# Patient Record
Sex: Female | Born: 1980 | Race: White | Hispanic: No | Marital: Single | Smoking: Never smoker
Health system: Southern US, Community
[De-identification: ages and names within clinical notes are randomized; demographics above are authoritative.]

## PROBLEM LIST (undated history)

## (undated) DIAGNOSIS — J302 Other seasonal allergic rhinitis: Secondary | ICD-10-CM

## (undated) DIAGNOSIS — F411 Generalized anxiety disorder: Secondary | ICD-10-CM

## (undated) DIAGNOSIS — F4310 Post-traumatic stress disorder, unspecified: Secondary | ICD-10-CM

## (undated) HISTORY — PX: WISDOM TOOTH EXTRACTION: SHX21

## (undated) HISTORY — DX: Other seasonal allergic rhinitis: J30.2

## (undated) HISTORY — DX: Generalized anxiety disorder: F41.1

## (undated) HISTORY — DX: Morbid (severe) obesity due to excess calories: E66.01

## (undated) HISTORY — DX: Post-traumatic stress disorder, unspecified: F43.10

---

## 2008-01-03 ENCOUNTER — Emergency Department (HOSPITAL_COMMUNITY): Admission: EM | Admit: 2008-01-03 | Discharge: 2008-01-03 | Payer: Self-pay | Admitting: Emergency Medicine

## 2008-02-05 ENCOUNTER — Encounter (INDEPENDENT_AMBULATORY_CARE_PROVIDER_SITE_OTHER): Payer: Self-pay | Admitting: Neurology

## 2008-02-05 ENCOUNTER — Ambulatory Visit: Payer: Self-pay

## 2008-02-26 ENCOUNTER — Ambulatory Visit: Payer: Self-pay | Admitting: Internal Medicine

## 2009-04-29 IMAGING — CT CT HEAD W/O CM
1 series · 16 of 30 positions shown, 20 images · non-contrast
Comparison: None

CLINICAL DATA: SEIZURE

CT HEAD WITHOUT CONTRAST
TECHNIQUE: Contiguous axial images were obtained from the base of
the skull through the vertex without contrast

[Series 2: brain · axial · 0.47mm/px · z∈[+115,+242]mm · 16 of 44 slices shown, 20 images]
[im 2/44  brain]
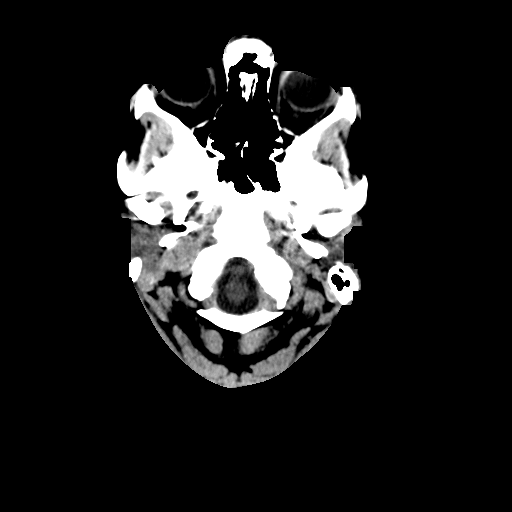
[im 2/44  bone]
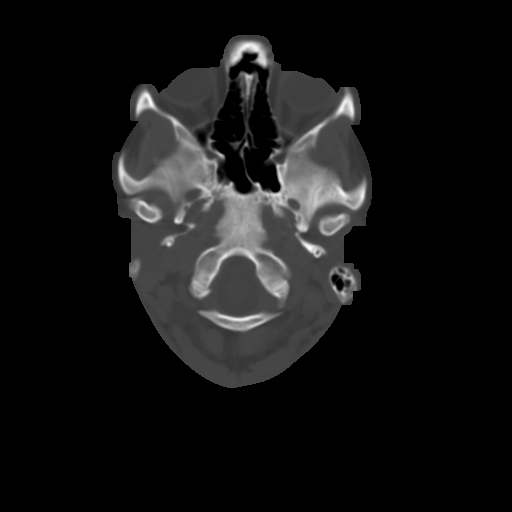
[im 5/44  brain]
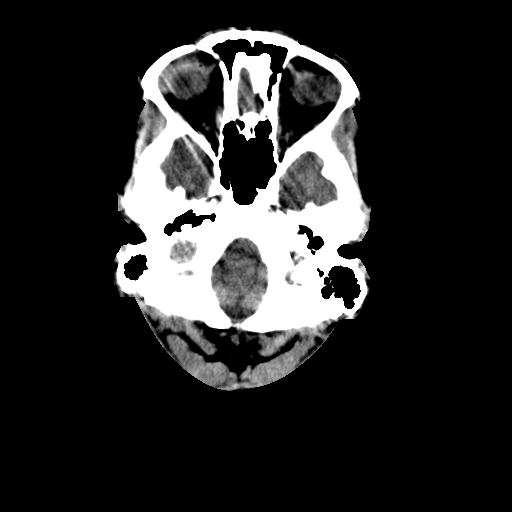
[im 8/44  brain]
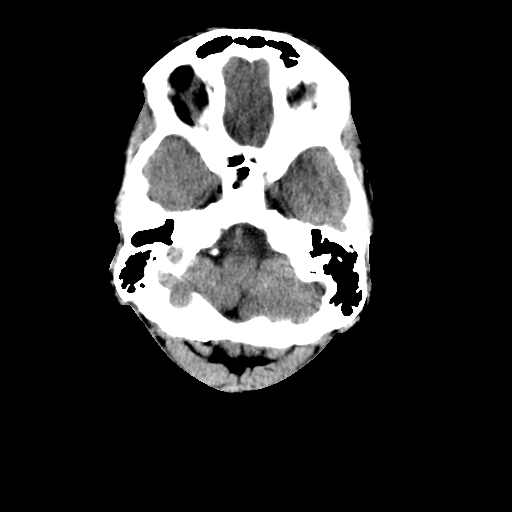
[im 11/44  brain]
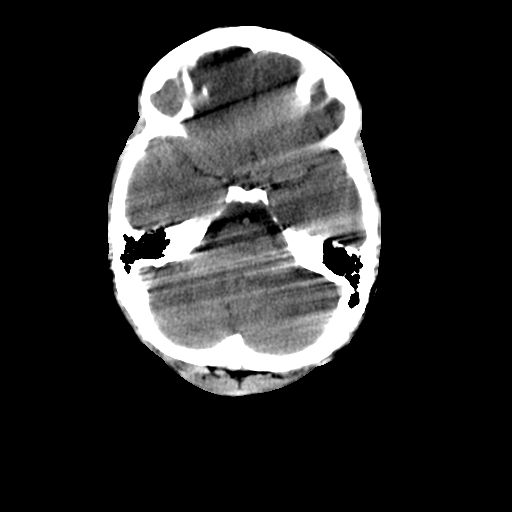
[im 12/44  brain]
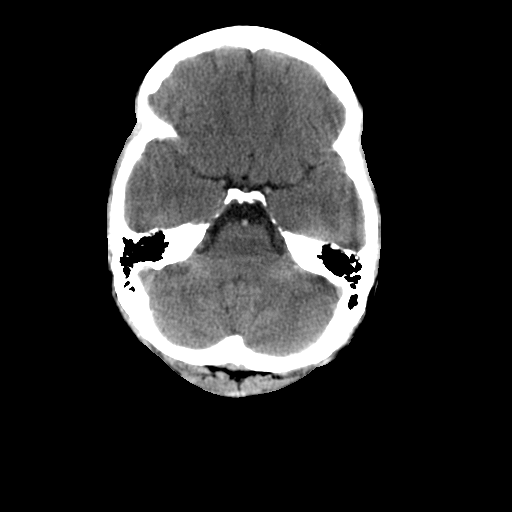
[im 12/44  bone]
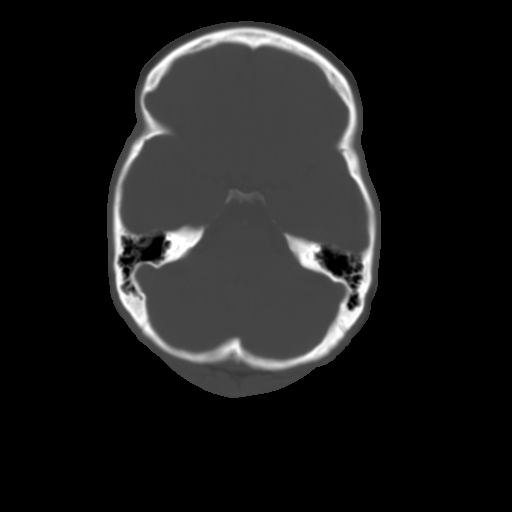
[im 15/44  brain]
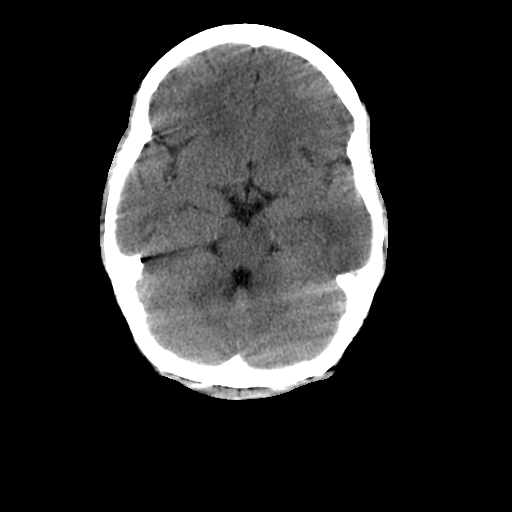
[im 18/44  brain]
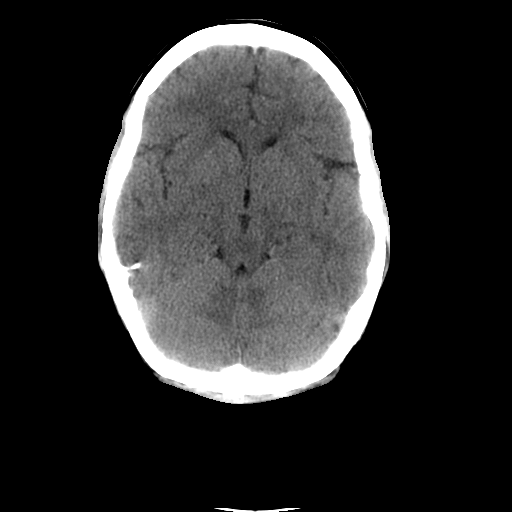
[im 21/44  brain]
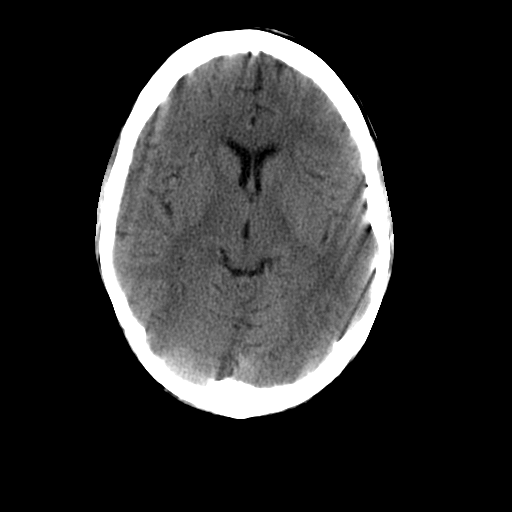
[im 23/44  brain]
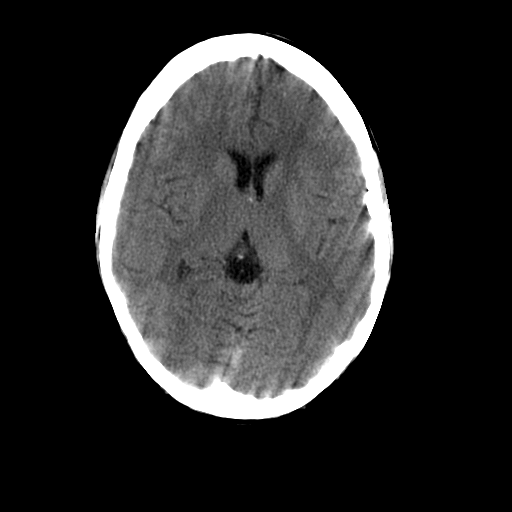
[im 23/44  bone]
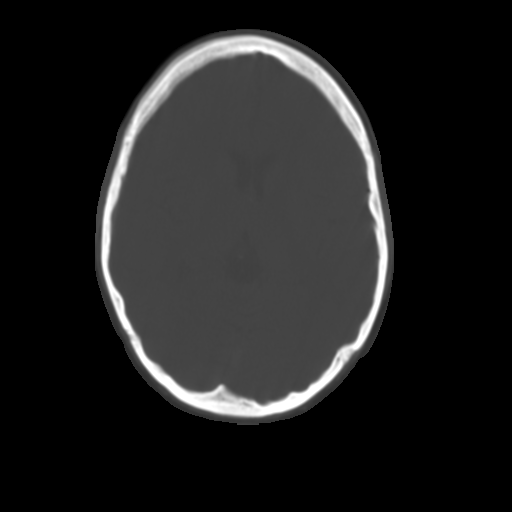
[im 26/44  brain]
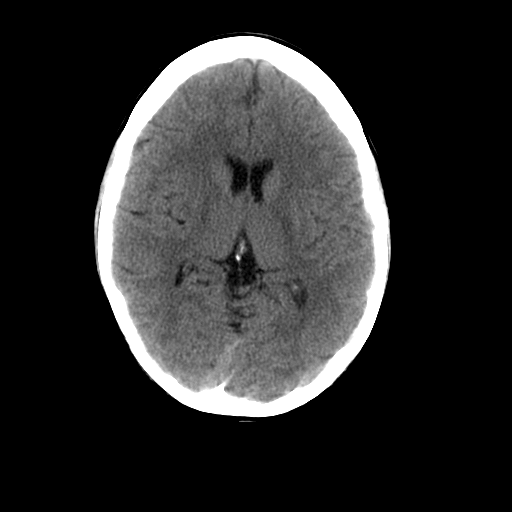
[im 29/44  brain]
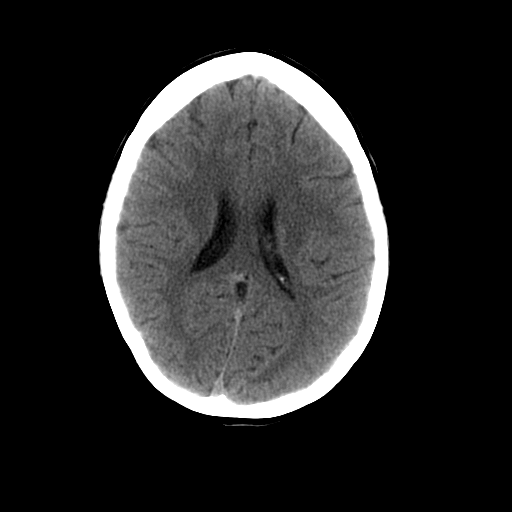
[im 32/44  brain]
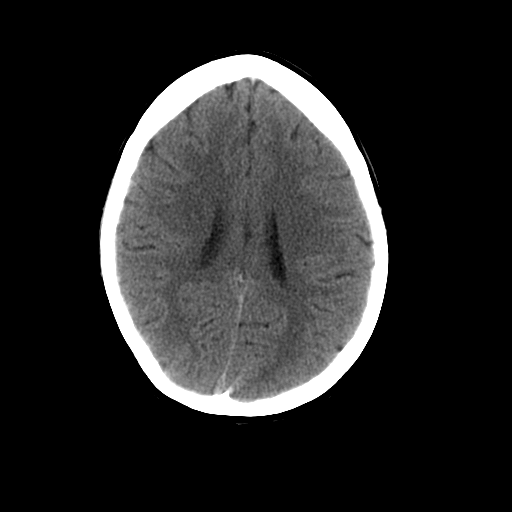
[im 33/44  brain]
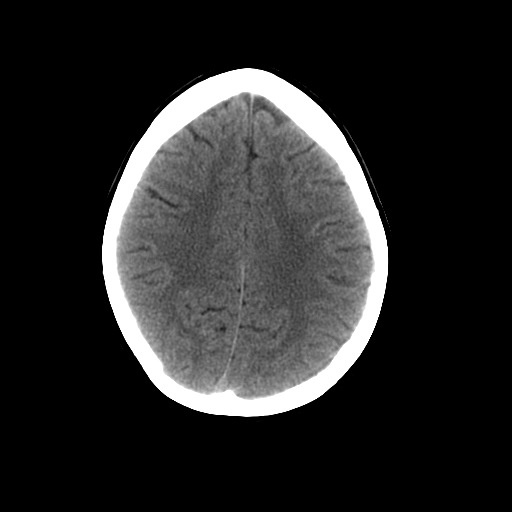
[im 33/44  bone]
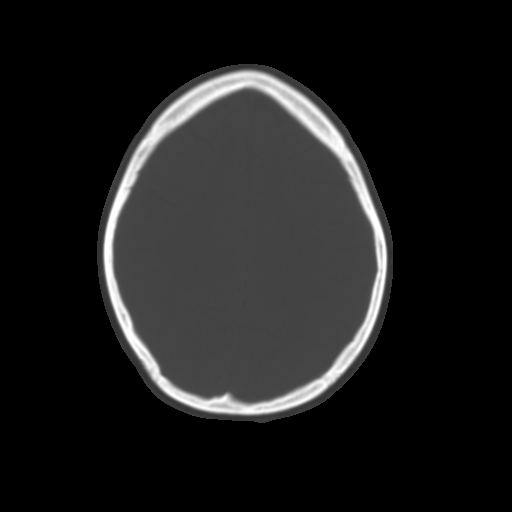
[im 36/44  brain]
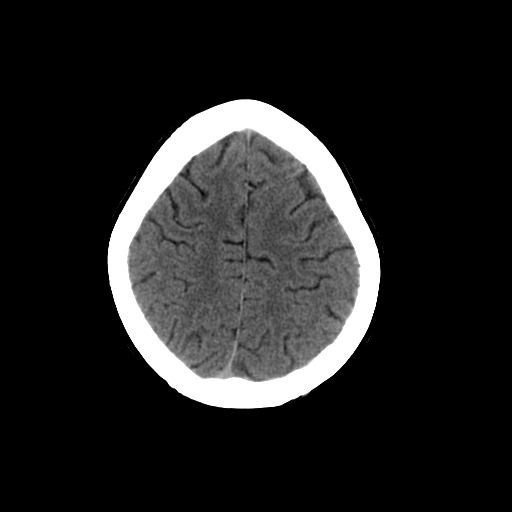
[im 39/44  brain]
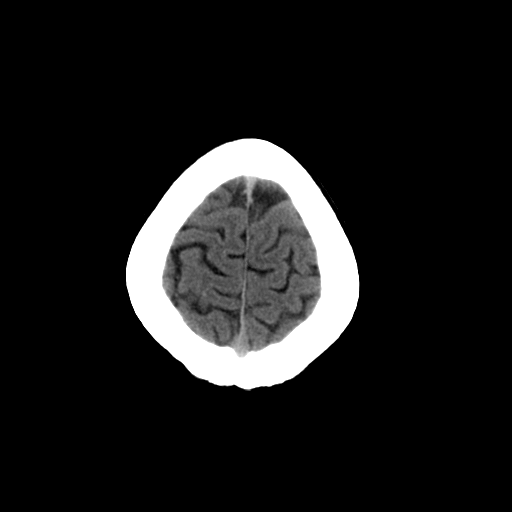
[im 42/44  brain]
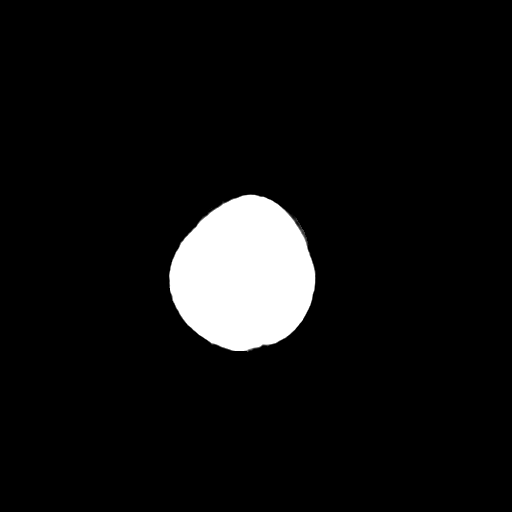

[16 of 30 positions shown; findings below may reference images not displayed]

FINDINGS: The brain has a normal appearance without evidence for
hemorrhage, acute infarction, hydrocephalus, or mass lesion.  There
is no extra axial fluid collection.  The skull and paranasal
sinuses are normal. Motion artifact degrades [HOSPITAL] the skull
base.
IMPRESSION: Normal CT of the head without contrast.
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
## 2011-06-06 LAB — DIFFERENTIAL
Basophils Absolute: 0
Basophils Relative: 0
Eosinophils Relative: 0
Monocytes Absolute: 0.6
Monocytes Relative: 4
Neutro Abs: 10.6 — ABNORMAL HIGH

## 2011-06-06 LAB — POCT PREGNANCY, URINE
Operator id: 22250
Preg Test, Ur: NEGATIVE

## 2011-06-06 LAB — CBC
HCT: 42.5
Hemoglobin: 15.2 — ABNORMAL HIGH
MCHC: 35.8
MCV: 92
RBC: 4.62
RDW: 11.4 — ABNORMAL LOW

## 2011-06-06 LAB — POCT I-STAT, CHEM 8
Calcium, Ion: 1.18
Chloride: 102
Glucose, Bld: 92
HCT: 44
TCO2: 25

## 2011-06-06 LAB — URINALYSIS, ROUTINE W REFLEX MICROSCOPIC
Bilirubin Urine: NEGATIVE
Ketones, ur: 15 — AB
Leukocytes, UA: NEGATIVE
Nitrite: NEGATIVE
Protein, ur: NEGATIVE
Urobilinogen, UA: 0.2
pH: 6

## 2011-06-06 LAB — URINE MICROSCOPIC-ADD ON

## 2012-07-23 ENCOUNTER — Ambulatory Visit (HOSPITAL_COMMUNITY): Payer: Self-pay | Admitting: Psychiatry

## 2012-10-03 ENCOUNTER — Ambulatory Visit: Payer: Self-pay | Admitting: Emergency Medicine

## 2012-10-03 VITALS — BP 128/89 | HR 76 | Temp 97.9°F | Resp 16 | Ht 64.25 in | Wt 221.0 lb

## 2012-10-03 DIAGNOSIS — N3000 Acute cystitis without hematuria: Secondary | ICD-10-CM

## 2012-10-03 DIAGNOSIS — N390 Urinary tract infection, site not specified: Secondary | ICD-10-CM

## 2012-10-03 LAB — POCT URINALYSIS DIPSTICK
Glucose, UA: NEGATIVE
Nitrite, UA: NEGATIVE
Urobilinogen, UA: 0.2
pH, UA: 6

## 2012-10-03 LAB — POCT UA - MICROSCOPIC ONLY
Casts, Ur, LPF, POC: NEGATIVE
Mucus, UA: NEGATIVE
Yeast, UA: NEGATIVE

## 2012-10-03 MED ORDER — PHENAZOPYRIDINE HCL 200 MG PO TABS: 200.0000 mg | ORAL_TABLET | Freq: Three times a day (TID) | ORAL | Status: AC | PRN

## 2012-10-03 MED ORDER — SULFAMETHOXAZOLE-TRIMETHOPRIM 800-160 MG PO TABS: 1.0000 | ORAL_TABLET | Freq: Two times a day (BID) | ORAL | Status: DC

## 2012-10-03 NOTE — Progress Notes (Signed)
[facility] [address] [place] [address] [phone]  Date:  10/03/2012   Name:  Nicole Burke   DOB:  10/24/1980   MRN:  [id number]  PCP:  No primary provider on file.    Chief Complaint: Urinary Tract Infection   History of Present Illness:  Nicole Burke is a 32 y.o. very pleasant female patient who presents with the following:  2 day history of dysuria and urgency and frequent urination.  No fever or chills, back pain.  Some nausea tonight and no vomiting or stool change. Developed some low back pain tonight.  Never had a UTI previously.  No GYN or GI symptoms.  There is no problem list on file for this patient.   Past Medical History  Diagnosis Date  . Anxiety   . Depression     Past Surgical History  Procedure Date  . Wisdom tooth extraction     History  Substance Use Topics  . Smoking status: Never Smoker   . Smokeless tobacco: Not on file  . Alcohol Use: 3.5 oz/week    7 drink(s) per week    Family History  Problem Relation Age of Onset  . Cancer Mother   . Anxiety disorder Mother   . Asthma Father   . Anxiety disorder Sister   . Kidney disease Maternal Grandmother   . Heart disease Paternal Grandmother   . Heart disease Paternal Grandfather     Allergies  Allergen Reactions  . Erythromycin Rash    Medication list has been reviewed and updated.  Current Outpatient Prescriptions on File Prior to Visit  Medication Sig Dispense Refill  . citalopram (CELEXA) 10 MG tablet Take 10 mg by mouth daily.        Review of Systems:  As per HPI, otherwise negative.    Physical Examination: Filed Vitals:   10/03/12 1919  BP: 128/89  Pulse: 76  Temp: 97.9 F (36.6 C)  Resp: 16   Filed Vitals:   10/03/12 1919  Height: 5' 4.25" (1.632 m)  Weight: 221 lb (100.245 kg)   Body mass index is 37.64 kg/(m^2). Ideal Body Weight: Weight in (lb) to have BMI = 25: 146.5   GEN: WDWN, NAD, Non-toxic, A & O x 3 HEENT: Atraumatic,  Normocephalic. Neck supple. No masses, No LAD. Ears and Nose: No external deformity. CV: RRR, No M/G/R. No JVD. No thrill. No extra heart sounds. PULM: CTA B, no wheezes, crackles, rhonchi. No retractions. No resp. distress. No accessory muscle use. ABD: S, NT, ND, +BS. No rebound. No HSM. EXTR: No c/c/e NEURO Normal gait.  PSYCH: Normally interactive. Conversant. Not depressed or anxious appearing.  Calm demeanor.    Assessment and Plan: Cystitis Septra pyridium  Carmelina Dane, MD  Results for orders placed in visit on 10/03/12  POCT URINALYSIS DIPSTICK      Component Value Range   Color, UA light yellow     Clarity, UA cloudy     Glucose, UA neg     Bilirubin, UA neg     Ketones, UA neg     Spec Grav, UA <=1.005     Blood, UA large     pH, UA 6.0     Protein, UA 100     Urobilinogen, UA 0.2     Nitrite, UA neg     Leukocytes, UA moderate (2+)

## 2012-10-03 NOTE — Patient Instructions (Addendum)
Urinary Tract Infection Urinary tract infections (UTIs) can develop anywhere along your urinary tract. Your urinary tract is your body's drainage system for removing wastes and extra water. Your urinary tract includes two kidneys, two ureters, a bladder, and a urethra. Your kidneys are a pair of bean-shaped organs. Each kidney is about the size of your fist. They are located below your ribs, one on each side of your spine. CAUSES Infections are caused by microbes, which are microscopic organisms, including fungi, viruses, and bacteria. These organisms are so small that they can only be seen through a microscope. Bacteria are the microbes that most commonly cause UTIs. SYMPTOMS  Symptoms of UTIs may vary by age and gender of the patient and by the location of the infection. Symptoms in young women typically include a frequent and intense urge to urinate and a painful, burning feeling in the bladder or urethra during urination. Older women and men are more likely to be tired, shaky, and weak and have muscle aches and abdominal pain. A fever may mean the infection is in your kidneys. Other symptoms of a kidney infection include pain in your back or sides below the ribs, nausea, and vomiting. DIAGNOSIS To diagnose a UTI, your caregiver will ask you about your symptoms. Your caregiver also will ask to provide a urine sample. The urine sample will be tested for bacteria and white blood cells. White blood cells are made by your body to help fight infection. TREATMENT  Typically, UTIs can be treated with medication. Because most UTIs are caused by a bacterial infection, they usually can be treated with the use of antibiotics. The choice of antibiotic and length of treatment depend on your symptoms and the type of bacteria causing your infection. HOME CARE INSTRUCTIONS  If you were prescribed antibiotics, take them exactly as your caregiver instructs you. Finish the medication even if you feel better after you  have only taken some of the medication.  Drink enough water and fluids to keep your urine clear or pale yellow.  Avoid caffeine, tea, and carbonated beverages. They tend to irritate your bladder.  Empty your bladder often. Avoid holding urine for long periods of time.  Empty your bladder before and after sexual intercourse.  After a bowel movement, women should cleanse from front to back. Use each tissue only once. SEEK MEDICAL CARE IF:   You have back pain.  You develop a fever.  Your symptoms do not begin to resolve within 3 days. SEEK IMMEDIATE MEDICAL CARE IF:   You have severe back pain or lower abdominal pain.  You develop chills.  You have nausea or vomiting.  You have continued burning or discomfort with urination. MAKE SURE YOU:   Understand these instructions.  Will watch your condition.  Will get help right away if you are not doing well or get worse. Document Released: 06/07/2005 Document Revised: 02/27/2012 Document Reviewed: 10/06/2011 [employer] Patient Information 2013 [employer].  

## 2012-10-12 ENCOUNTER — Ambulatory Visit: Payer: Self-pay | Admitting: Family Medicine

## 2012-10-12 VITALS — BP 120/86 | HR 85 | Temp 98.5°F | Resp 16 | Ht 65.0 in | Wt 220.0 lb

## 2012-10-12 DIAGNOSIS — J029 Acute pharyngitis, unspecified: Secondary | ICD-10-CM

## 2012-10-12 DIAGNOSIS — N390 Urinary tract infection, site not specified: Secondary | ICD-10-CM

## 2012-10-12 DIAGNOSIS — R05 Cough: Secondary | ICD-10-CM

## 2012-10-12 DIAGNOSIS — J069 Acute upper respiratory infection, unspecified: Secondary | ICD-10-CM

## 2012-10-12 DIAGNOSIS — R059 Cough, unspecified: Secondary | ICD-10-CM

## 2012-10-12 LAB — POCT URINALYSIS DIPSTICK
Bilirubin, UA: NEGATIVE
Blood, UA: NEGATIVE
Glucose, UA: NEGATIVE
Ketones, UA: NEGATIVE
Leukocytes, UA: NEGATIVE
Nitrite, UA: NEGATIVE
Protein, UA: NEGATIVE
Spec Grav, UA: 1.01
Urobilinogen, UA: 0.2
pH, UA: 7.5

## 2012-10-12 LAB — POCT UA - MICROSCOPIC ONLY
Bacteria, U Microscopic: NEGATIVE
Casts, Ur, LPF, POC: NEGATIVE
Crystals, Ur, HPF, POC: NEGATIVE
Mucus, UA: NEGATIVE
RBC, urine, microscopic: NEGATIVE
WBC, Ur, HPF, POC: NEGATIVE
Yeast, UA: NEGATIVE

## 2012-10-12 LAB — POCT RAPID STREP A (OFFICE): Rapid Strep A Screen: NEGATIVE

## 2012-10-12 NOTE — Progress Notes (Signed)
[facility]:  Office Visit  Chief Complaint:  Chief Complaint  Patient presents with  . Cough  . Fever    HPI: Nicole Burke is a 32 y.o. female who complains of  URI sxs with fever Tmax of 104, dry cough started 8 days ago on Friday.  She has not had any fevers since, has taken otc meds. 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  She was recently on Septra for  3-4 days. No culture of urine was done, she did not take all of her Septra.She took about 3 days worth and Monday was the last day she took it, which was 5 days ago. Then she started having URI sxs. She started having  early Wednesday evening , Tmax 100.2.  + ear pain, sorethroat. She wants to know if she can have steroids to make her feel better quickly.   Past Medical History  Diagnosis Date  . Anxiety   . Depression    Past Surgical History  Procedure Date  . Wisdom tooth extraction    History   Social History  . Marital Status: Single    Spouse Name: N/A    Number of Children: N/A  . Years of Education: N/A   Social History Main Topics  . Smoking status: Never Smoker   . Smokeless tobacco: None  . Alcohol Use: 3.5 oz/week    7 drink(s) per week  . Drug Use: No  . Sexually Active: Yes    Birth Control/ Protection: Condom   Other Topics Concern  . None   Social History Narrative  . None   Family History  Problem Relation Age of Onset  . Cancer Mother   . Anxiety disorder Mother   . Asthma Father   . Anxiety disorder Sister   . Kidney disease Maternal Grandmother   . Heart disease Paternal Grandmother   . Heart disease Paternal Grandfather    Allergies  Allergen Reactions  . Erythromycin Rash   Prior to Admission medications   Medication Sig Start Date End Date Taking? Authorizing Provider  ALPRAZolam (XANAX) 0.25 MG tablet Take 0.25 mg by mouth at bedtime as needed.   Yes Historical Provider, MD  aspirin 81 MG tablet Take 81 mg by mouth daily.   Yes Historical Provider, MD   aspirin-sod bicarb-citric acid (ALKA-SELTZER) 325 MG TBEF Take 325 mg by mouth every 6 (six) hours as needed.   Yes Historical Provider, MD  citalopram (CELEXA) 10 MG tablet Take 10 mg by mouth daily.   Yes Historical Provider, MD  Pseudoeph-Doxylamine-DM-APAP (NYQUIL PO) Take by mouth.   Yes Historical Provider, MD  sulfamethoxazole-trimethoprim (BACTRIM DS,SEPTRA DS) 800-160 MG per tablet Take 1 tablet by mouth 2 (two) times daily. 10/03/12   Phillips Odor, MD     ROS: The patient denies chills, night sweats, unintentional weight loss, chest pain, palpitations, wheezing, dyspnea on exertion, nausea, , abdominal pain, dysuria, hematuria, melena, numbness, weakness, or tingling.  All other systems have been reviewed and were otherwise negative with the exception of those mentioned in the HPI and as above.    PHYSICAL EXAM: Filed Vitals:   10/12/12 1428  BP: 120/86  Pulse: 85  Temp: 98.5 F (36.9 C)  Resp: 16   Filed Vitals:   10/12/12 1428  Height: 5\' 5"  (1.651 m)  Weight: 220 lb (99.791 kg)   Body mass index is 36.61 kg/(m^2).  General: Alert, no acute distress HEENT:  Normocephalic, atraumatic, oropharynx patent. TM nl,  no sinus tenderness, + erythematous tonsils, however no swelling, no exudates. +/-PND Cardiovascular:  Regular rate and rhythm, no rubs murmurs or gallops.  No Carotid bruits, radial pulse intact. No pedal edema.  Respiratory: Clear to auscultation bilaterally.  No wheezes, rales, or rhonchi.  No cyanosis, no use of accessory musculature GI: No organomegaly, abdomen is soft and non-tender, positive bowel sounds.  No masses. Skin: No rashes. Neurologic: Facial musculature symmetric. Psychiatric: Patient is appropriate throughout our interaction. Lymphatic: No cervical lymphadenopathy Musculoskeletal: Gait intact.   LABS: Results for orders placed in visit on 10/12/12  POCT RAPID STREP A (OFFICE)      Component Value Range   Rapid Strep A Screen Negative   Negative  POCT URINALYSIS DIPSTICK      Component Value Range   Color, UA yellow     Clarity, UA clear     Glucose, UA neg     Bilirubin, UA neg     Ketones, UA neg     Spec Grav, UA 1.010     Blood, UA neg     pH, UA 7.5     Protein, UA neg     Urobilinogen, UA 0.2     Nitrite, UA neg     Leukocytes, UA Negative    POCT UA - MICROSCOPIC ONLY      Component Value Range   WBC, Ur, HPF, POC neg     RBC, urine, microscopic neg     Bacteria, U Microscopic neg     Mucus, UA neg     Epithelial cells, urine per micros 0-1     Crystals, Ur, HPF, POC neg     Casts, Ur, LPF, POC neg     Yeast, UA neg       EKG/XRAY:   Primary read interpreted by Dr[facility] Con[facility]s at [facility].   ASSESSMENT/PLAN: Encounter Diagnoses  Name Primary?  . Sore throat Yes  . UTI (lower urinary tract infection)    Viral URI with cough [facility] mouth wash for sore throat We discussed that taking steroids may not help her. It will make her feel as if she has a lot of energy but I don't have a good medical reason to give her prednisone at this time UTI resolved F/u prn    Nicole Hetzer [name], DO 10/12/2012 3:23 PM

## 2012-10-12 NOTE — Patient Instructions (Addendum)
Viral Pharyngitis  Viral pharyngitis is a viral infection that produces redness, pain, and swelling (inflammation) of the throat. It can spread from person to person (contagious).  CAUSES  Viral pharyngitis is caused by inhaling a large amount of certain germs called viruses. Many different viruses cause viral pharyngitis.  SYMPTOMS  Symptoms of viral pharyngitis include:   Sore throat.   Tiredness.   Stuffy nose.   Low-grade fever.   Congestion.   Cough.  TREATMENT  Treatment includes rest, drinking plenty of fluids, and the use of over-the-counter medication (approved by your caregiver).  HOME CARE INSTRUCTIONS    Drink enough fluids to keep your urine clear or pale yellow.   Eat soft, cold foods such as ice cream, frozen ice pops, or gelatin dessert.   Gargle with warm salt water (1 tsp salt per 1 qt of water).   If over age 7, throat lozenges may be used safely.   Only take over-the-counter or prescription medicines for pain, discomfort, or fever as directed by your caregiver. Do not take aspirin.  To help prevent spreading viral pharyngitis to others, avoid:   Mouth-to-mouth contact with others.   Sharing utensils for eating and drinking.   Coughing around others.  SEEK MEDICAL CARE IF:    You are better in a few days, then become worse.   You have a fever or pain not helped by pain medicines.   There are any other changes that concern you.  Document Released: 06/07/2005 Document Revised: 11/20/2011 Document Reviewed: 11/03/2010  [employer] Patient Information 2013 [employer].

## 2014-04-28 ENCOUNTER — Encounter: Payer: Self-pay | Admitting: Certified Nurse Midwife

## 2014-06-15 ENCOUNTER — Ambulatory Visit (INDEPENDENT_AMBULATORY_CARE_PROVIDER_SITE_OTHER): Payer: BC Managed Care – PPO | Admitting: Family Medicine

## 2014-06-15 ENCOUNTER — Ambulatory Visit (INDEPENDENT_AMBULATORY_CARE_PROVIDER_SITE_OTHER): Payer: BC Managed Care – PPO

## 2014-06-15 VITALS — BP 124/76 | HR 90 | Temp 98.2°F | Resp 17 | Ht 65.0 in | Wt 248.0 lb

## 2014-06-15 DIAGNOSIS — M25571 Pain in right ankle and joints of right foot: Secondary | ICD-10-CM

## 2014-06-15 DIAGNOSIS — S93401A Sprain of unspecified ligament of right ankle, initial encounter: Secondary | ICD-10-CM

## 2014-06-15 NOTE — Patient Instructions (Signed)

## 2014-06-15 NOTE — Progress Notes (Signed)
Chief Complaint:  Chief Complaint  Patient presents with  . Ankle Injury    HPI: Nicole Burke is a 33 y.o. female who is here for  Right ankle sprain about 5 days ago onThursday night, she was walking and heard a pop and started having swelling, has had pain with weightbearing, has tried RICE, she works at the [employer] and decided to come in today. She has been doing RICE and also wearing an ankle brace she got and then also brought her crutches in so she can have more support at theend of the day in case her ankle started hurting again but her employer told her to get the ankle checked out. No prior ankle injuries.  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      Past Medical History  Diagnosis Date  . Anxiety   . Depression   . Allergy    Past Surgical History  Procedure Laterality Date  . Wisdom tooth extraction     History   Social History  . Marital Status: Single    Spouse Name: N/A    Number of Children: N/A  . Years of Education: N/A   Social History Main Topics  . Smoking status: Never Smoker   . Smokeless tobacco: None  . Alcohol Use: 3.5 oz/week    7 drink(s) per week  . Drug Use: No  . Sexual Activity: Yes    Birth Control/ Protection: Condom   Other Topics Concern  . None   Social History Narrative  . None   Family History  Problem Relation Age of Onset  . Cancer Mother   . Anxiety disorder Mother   . Asthma Father   . Anxiety disorder Sister   . Kidney disease Maternal Grandmother   . Heart disease Paternal Grandmother   . Heart disease Paternal Grandfather    Allergies  Allergen Reactions  . Erythromycin Rash   Prior to Admission medications   Medication Sig Start Date End Date  Taking? Authorizing Provider  ALPRAZolam (XANAX) 0.25 MG tablet Take 0.25 mg by mouth at bedtime as needed.   Yes Historical Provider, MD  aspirin 81 MG tablet Take 81 mg by mouth daily.   Yes Historical Provider, MD  aspirin-sod bicarb-citric acid (ALKA-SELTZER) 325 MG TBEF Take 325 mg by mouth every 6 (six) hours as needed.   Yes Historical Provider, MD  citalopram (CELEXA) 10 MG tablet Take 10 mg by mouth daily.   Yes Historical Provider, MD  Pseudoeph-Doxylamine-DM-APAP (NYQUIL PO) Take by mouth.   Yes Historical Provider, MD  sulfamethoxazole-trimethoprim (BACTRIM DS,SEPTRA DS) 800-160 MG per tablet Take 1 tablet by mouth 2 (two) times daily. 10/03/12  Yes Carmelina DaneJeffery S Anderson, MD     ROS: The patient denies fevers, chills, night sweats, unintentional weight loss, chest pain, palpitations, wheezing, dyspnea on exertion, nausea, vomiting, abdominal pain, dysuria, hematuria, melena, numbness, weakness, or tingling.   All other systems have been reviewed and were otherwise negative with the exception of those mentioned in the HPI and as above.    PHYSICAL EXAM: Filed Vitals:   06/15/14 1136  BP: 124/76  Pulse: 90  Temp:  98.2 F (36.8 C)  Resp: 17   Filed Vitals:   06/15/14 1136  Height: 5\' 5"  (1.651 m)  Weight: 248 lb (112.492 kg)   Body mass index is 41.27 kg/(m^2).  General: Alert, no acute distress HEENT:  Normocephalic, atraumatic, oropharynx patent. EOMI, PERRLA Cardiovascular:  Regular rate and rhythm, no rubs murmurs or gallops.  No Carotid bruits, radial pulse intact. No pedal edema.  Respiratory: Clear to auscultation bilaterally.  No wheezes, rales, or rhonchi.  No cyanosis, no use of accessory musculature GI: No organomegaly, abdomen is soft and non-tender, positive bowel sounds.  No masses. Skin: No rashes. Neurologic: Facial musculature symmetric. Psychiatric: Patient is appropriate throughout our interaction. Lymphatic: No cervical lymphadenopathy Musculoskeletal:  Gait intact. + right ankle -+ swelling, more lateral than medial  tener lateral mal and also anterior ankle, full ROm but painful with inversion + DP, minimally ecchymosis, 5/5 strength   LABS: Results for orders placed in visit on 10/12/12  POCT RAPID STREP A (OFFICE)      Result Value Ref Range   Rapid Strep A Screen Negative  Negative  POCT URINALYSIS DIPSTICK      Result Value Ref Range   Color, UA yellow     Clarity, UA clear     Glucose, UA neg     Bilirubin, UA neg     Ketones, UA neg     Spec Grav, UA 1.010     Blood, UA neg     pH, UA 7.5     Protein, UA neg     Urobilinogen, UA 0.2     Nitrite, UA neg     Leukocytes, UA Negative    POCT UA - MICROSCOPIC ONLY      Result Value Ref Range   WBC, Ur, HPF, POC neg     RBC, urine, microscopic neg     Bacteria, U Microscopic neg     Mucus, UA neg     Epithelial cells, urine per micros 0-1     Crystals, Ur, HPF, POC neg     Casts, Ur, LPF, POC neg     Yeast, UA neg       EKG/XRAY:   Primary read interpreted by Dr. Conley Rolls at [facility]. Neg for fracture or dislocation   ASSESSMENT/PLAN: Encounter Diagnoses  Name Primary?  . Sprain of ankle, right, initial encounter Yes  . Pain in joint, ankle and foot, right    Ankle brace that she already has, the cam boot she tried on she did not like Work note given, activity modification for 1 week, if no improvement or worsening sxs then need to f/u Decline pain meds RICE, tylenol and ibuprofen prn F/u   Gross sideeffects, risk and benefits, and alternatives of medications d/w patient. Patient is aware that all medications have potential sideeffects and we are unable to predict every sideeffect or drug-drug interaction that may occur.  Levina Boyack [name], DO 06/15/2014 1:36 PM

## 2014-06-23 ENCOUNTER — Ambulatory Visit (INDEPENDENT_AMBULATORY_CARE_PROVIDER_SITE_OTHER): Payer: BC Managed Care – PPO | Admitting: Family Medicine

## 2014-06-23 VITALS — BP 130/82 | HR 90 | Temp 98.8°F | Resp 16 | Ht 65.0 in | Wt 248.0 lb

## 2014-06-23 DIAGNOSIS — S93401D Sprain of unspecified ligament of right ankle, subsequent encounter: Secondary | ICD-10-CM

## 2014-06-23 DIAGNOSIS — M25571 Pain in right ankle and joints of right foot: Secondary | ICD-10-CM

## 2014-06-23 NOTE — Progress Notes (Signed)
Chief Complaint:  Chief Complaint  Patient presents with  . Follow-up    right ankle     HPI: Nicole Burke is a 33 y.o. female who is here for  Recheck of her right ankle. She reinjured it at work , she works in a daycare. She was in a toddler room and was pulling another toddler and another kid went for another toddler's eyes and she was trying to break it all up and when she moved from a sitting to a standing position she stepped on her toe strangelyand inverted her ankle again,  there was immediate pain, lasted for 5 minutes and was the same amount of pain she had the first night. The pain has gone down but still worse than where she was prior to hurting it again today. She has been putting weight on it but she has been doing RICE. She has n//w/t.   Past Medical History  Diagnosis Date  . Anxiety   . Depression   . Allergy    Past Surgical History  Procedure Laterality Date  . Wisdom tooth extraction     History   Social History  . Marital Status: Single    Spouse Name: N/A    Number of Children: N/A  . Years of Education: N/A   Social History Main Topics  . Smoking status: Never Smoker   . Smokeless tobacco: None  . Alcohol Use: 3.5 oz/week    7 drink(s) per week  . Drug Use: No  . Sexual Activity: Yes    Birth Control/ Protection: Condom   Other Topics Concern  . None   Social History Narrative  . None   Family History  Problem Relation Age of Onset  . Cancer Mother   . Anxiety disorder Mother   . Asthma Father   . Anxiety disorder Sister   . Kidney disease Maternal Grandmother   . Heart disease Paternal Grandmother   . Heart disease Paternal Grandfather    Allergies  Allergen Reactions  . Erythromycin Rash   Prior to Admission medications   Medication Sig Start Date End Date Taking? Authorizing Provider  ALPRAZolam (XANAX) 0.25 MG tablet Take 0.25 mg by mouth at bedtime as needed.   Yes Historical Provider, MD  aspirin 81 MG tablet Take 81  mg by mouth. PRN   Yes Historical Provider, MD  aspirin-sod bicarb-citric acid (ALKA-SELTZER) 325 MG TBEF Take 325 mg by mouth every 6 (six) hours as needed.   Yes Historical Provider, MD  citalopram (CELEXA) 10 MG tablet Take 10 mg by mouth daily.    Historical Provider, MD  Pseudoeph-Doxylamine-DM-APAP (NYQUIL PO) Take by mouth. PRN    Historical Provider, MD     ROS: The patient denies fevers, chills, night sweats, unintentional weight loss, chest pain, palpitations, wheezing, dyspnea on exertion, nausea, vomiting, abdominal pain, dysuria, hematuria, melena, numbness, weakness, or tingling.   All other systems have been reviewed and were otherwise negative with the exception of those mentioned in the HPI and as above.    PHYSICAL EXAM: Filed Vitals:   06/23/14 1956  BP: 130/82  Pulse: 90  Temp: 98.8 F (37.1 C)  Resp: 16   Filed Vitals:   06/23/14 1956  Height: 5\' 5"  (1.651 m)  Weight: 248 lb (112.492 kg)   Body mass index is 41.27 kg/(m^2).  General: Alert, no acute distress HEENT:  Normocephalic, atraumatic, oropharynx patent. EOMI, PERRLA Cardiovascular:  Regular rate and rhythm, no rubs murmurs  or gallops.  No Carotid bruits, radial pulse intact. No pedal edema.  Respiratory: Clear to auscultation bilaterally.  No wheezes, rales, or rhonchi.  No cyanosis, no use of accessory musculature GI: No organomegaly, abdomen is soft and non-tender, positive bowel sounds.  No masses. Skin: No rashes. Neurologic: Facial musculature symmetric. Psychiatric: Patient is appropriate throughout our interaction. Lymphatic: No cervical lymphadenopathy Musculoskeletal: Gait intact. + right ankle -+ swelling, more lateral than medial  tender lateral mal and also anterior ankle, full ROM  + DP, no ecchymosis5/5 strength   LABS: Results for orders placed in visit on 10/12/12  POCT RAPID STREP A (OFFICE)      Result Value Ref Range   Rapid Strep A Screen Negative  Negative  POCT  URINALYSIS DIPSTICK      Result Value Ref Range   Color, UA yellow     Clarity, UA clear     Glucose, UA neg     Bilirubin, UA neg     Ketones, UA neg     Spec Grav, UA 1.010     Blood, UA neg     pH, UA 7.5     Protein, UA neg     Urobilinogen, UA 0.2     Nitrite, UA neg     Leukocytes, UA Negative    POCT UA - MICROSCOPIC ONLY      Result Value Ref Range   WBC, Ur, HPF, POC neg     RBC, urine, microscopic neg     Bacteria, U Microscopic neg     Mucus, UA neg     Epithelial cells, urine per micros 0-1     Crystals, Ur, HPF, POC neg     Casts, Ur, LPF, POC neg     Yeast, UA neg       EKG/XRAY:   [facility]   ASSESSMENT/PLAN: Encounter Diagnoses  Name Primary?  . Sprain of ankle, right, subsequent encounter Yes  . Pain in joint, ankle and foot, right    Will give her camboot, she will use it more for protection of anke at work May use her ankle brace she ahs ahd at home and not when at work C/w ibuprofen and or tylenol, she will call with another NSAID name her mo recommends F.u in 2 weeks, c/w RICE prn and ROM   Gross sideeffects, risk and benefits, and alternatives of medications d/w patient. Patient is aware that all medications have potential sideeffects and we are unable to predict every sideeffect or drug-drug interaction that may occur.  Hamilton CapriLE, Saramarie Stinger [name], DO 06/23/2014 9:26 PM

## 2014-06-24 ENCOUNTER — Telehealth: Payer: Self-pay

## 2014-06-24 NOTE — Telephone Encounter (Signed)
NSAID her mom suggested was Sulindac.  Pt also complaints of fatigue. She says the fatigue feels the same as when she first sprained her ankle. I told her it may be unrelated to her ankle.   Pt also wants letter from yesterday faxed to [phone]. I will fax letter.

## 2014-06-24 NOTE — Telephone Encounter (Signed)
Pt was seen by Dr.Le for an ankle sprain and would like to speak with someone regarding her diagnosis. Please call [phone]

## 2014-06-25 MED ORDER — SULINDAC 150 MG PO TABS: 150.0000 mg | ORAL_TABLET | Freq: Two times a day (BID) | ORAL | Status: DC

## 2014-08-24 ENCOUNTER — Ambulatory Visit (INDEPENDENT_AMBULATORY_CARE_PROVIDER_SITE_OTHER): Payer: BC Managed Care – PPO

## 2014-08-24 ENCOUNTER — Ambulatory Visit (INDEPENDENT_AMBULATORY_CARE_PROVIDER_SITE_OTHER): Payer: BC Managed Care – PPO | Admitting: Family Medicine

## 2014-08-24 VITALS — BP 124/76 | HR 112 | Temp 98.1°F | Resp 20 | Ht 66.0 in | Wt 252.0 lb

## 2014-08-24 DIAGNOSIS — R059 Cough, unspecified: Secondary | ICD-10-CM

## 2014-08-24 DIAGNOSIS — R05 Cough: Secondary | ICD-10-CM

## 2014-08-24 DIAGNOSIS — J189 Pneumonia, unspecified organism: Secondary | ICD-10-CM

## 2014-08-24 MED ORDER — AZITHROMYCIN 250 MG PO TABS: ORAL_TABLET | ORAL | Status: DC

## 2014-08-24 MED ORDER — BENZONATATE 100 MG PO CAPS: 100.0000 mg | ORAL_CAPSULE | Freq: Three times a day (TID) | ORAL | Status: DC | PRN

## 2014-08-24 NOTE — Progress Notes (Signed)
Subjective:    Patient ID: Nicole Burke, female    DOB: 1981-06-09, 33 y.o.   MRN: [id number]  This chart was scribed for Norberto SorensonEva Shaw, MD by Murriel HopperAlec Bankhead, ED Scribe. The patient's care was started at [facility]:12 PM.  Chief Complaint  Patient presents with  . Cough    x 3 weeks  . Nasal Congestion  . Sore Throat    HPI   HPI Comments: Nicole Burke is a 33 y.o. female who presents to the Emergency Department complaining of a productive cough with associated nasal congestion and sore throat that has been present for three weeks. Pt states that three weeks ago she had cold-like symptoms and notes that her sore throat and congestion were her most prevalent symptoms. Pt states that she also lost her voice completely for three straight days around two weeks ago. Pt states that her sore throat and congestion have improved since the onset of her symptoms, but notes that her cough has worsened. Pt notes that she has been coughing up yellow and grey phlegm, and states that OTC medications have not improved her symptoms.    Past Medical History  Diagnosis Date  . Anxiety   . Depression   . Allergy    Current Outpatient Prescriptions on File Prior to Visit  Medication Sig Dispense Refill  . ALPRAZolam (XANAX) 0.25 MG tablet Take 0.25 mg by mouth at bedtime as needed.    Marland Kitchen. aspirin 81 MG tablet Take 81 mg by mouth. PRN    . aspirin-sod bicarb-citric acid (ALKA-SELTZER) 325 MG TBEF Take 325 mg by mouth every 6 (six) hours as needed.    . citalopram (CELEXA) 10 MG tablet Take 10 mg by mouth daily.    . Pseudoeph-Doxylamine-DM-APAP (NYQUIL PO) Take by mouth. PRN    . sulindac (CLINORIL) 150 MG tablet Take 1 tablet (150 mg total) by mouth 2 (two) times daily. Take as needed  with food, no other NSAIDs (Patient not taking: Reported on 08/24/2014) 30 tablet 0   No current facility-administered medications on file prior to visit.   Allergies  Allergen Reactions  . Erythromycin Rash     Review of Systems    HENT: Positive for congestion and sore throat.   Respiratory: Positive for cough.        Objective:   Physical Exam  Constitutional: She is oriented to person, place, and time. She appears well-developed and well-nourished.  HENT:  Head: Normocephalic and atraumatic.  Right Ear: Tympanic membrane normal.  Left Ear: Tympanic membrane normal.  Nose: Rhinorrhea present.  Errythemic, Dry mucosa Small amount of purulent Rhinorrhea    Neck:  No supraclavicular adenopathy Thyroid normal No cervical adenopathy  Cardiovascular: Normal rate, regular rhythm, S1 normal, S2 normal and normal heart sounds.   No murmur heard. Pulmonary/Chest: Effort normal. She has wheezes.  Coarse breath sounds throughout with bilateral expiratory wheeze  Abdominal: She exhibits no distension.  Neurological: She is alert and oriented to person, place, and time.  Skin: Skin is warm and dry.  Psychiatric: She has a normal mood and affect.  Nursing note and vitals reviewed.   BP 124/76 mmHg  Pulse 112  Temp(Src) 98.1 F (36.7 C)  Resp 20  Ht 5\' 6"  (1.676 m)  Wt 252 lb (114.306 kg)  BMI 40.69 kg/m2  SpO2 92%  LMP 08/10/2014  Primary Reading by Dr. Clelia CroftShaw:  Chest: coarsened interstitial markings, question of slight increase in markings in left lower lobe    Dg Chest  2 View  08/24/2014   CLINICAL DATA:  cough  EXAM: CHEST  2 VIEW  COMPARISON:  None.  FINDINGS: The heart size and mediastinal contours are within normal limits. Both lungs are clear. The visualized skeletal structures are unremarkable.  IMPRESSION: No active cardiopulmonary disease.   Electronically Signed   By: Maryclare BeanArt  Hoss M.D.   On: 08/24/2014 19:36     Assessment & Plan:   Cough - Plan: DG Chest 2 View  Walking pneumonia  Meds ordered this encounter  Medications  . azithromycin (ZITHROMAX) 250 MG tablet    Sig: Take 2 tabs PO x 1 dose, then 1 tab PO QD x 4 days    Dispense:  6 tablet    Refill:  0  . benzonatate (TESSALON) 100  MG capsule    Sig: Take 1-2 capsules (100-200 mg total) by mouth 3 (three) times daily as needed for cough.    Dispense:  60 capsule    Refill:  0    I personally performed the services described in this documentation, which was scribed in my presence. The recorded information has been reviewed and considered, and addended by me as needed.  Norberto SorensonEva Shaw, MD MPH

## 2014-08-24 NOTE — Patient Instructions (Signed)

## 2014-08-26 ENCOUNTER — Telehealth: Payer: Self-pay

## 2014-08-26 NOTE — Telephone Encounter (Signed)
Pt states she was diagnosed with pneumonia and was given an oow note for Monday and Tuesday, would like to have for today also. Please call  [phone] and she will let us know the fax when we call her back

## 2014-08-26 NOTE — Telephone Encounter (Signed)
Note made and informed pt. Will fax to [phone].

## 2014-09-11 ENCOUNTER — Other Ambulatory Visit: Payer: Self-pay

## 2014-09-11 NOTE — Telephone Encounter (Signed)
Called her, advised will send refill request for Tessalon. Is her cough more productive? Does she have fever? Patient states she does not have a fever and the cough is not productive.  She is not using Mucinex, she will try this, she knows to come in if she develops fever or if the cough becomes very productive.  Tessalon pended please advise.

## 2014-09-11 NOTE — Telephone Encounter (Signed)
Pt called in stating that she was here 3 weeks ago and that she was starting to feel better after taking her Z-pack but that last night 09/10/14 she started getting a lot of drainage and congestion again and also coughing a lot more. She was wondering if she could get a refill on her benzonatate (TESSALON) 100 MG capsule and also if the doctor thought maybe she should come back in and be seen again since she doesn't seem to be getting better.  She uses the [facility] on [address], and she would like to be called back at [phone]

## 2014-09-12 MED ORDER — BENZONATATE 100 MG PO CAPS: 100.0000 mg | ORAL_CAPSULE | Freq: Three times a day (TID) | ORAL | Status: DC | PRN

## 2014-10-05 ENCOUNTER — Ambulatory Visit (INDEPENDENT_AMBULATORY_CARE_PROVIDER_SITE_OTHER): Payer: Managed Care, Other (non HMO) | Admitting: Family Medicine

## 2014-10-05 VITALS — BP 128/88 | HR 109 | Temp 98.2°F | Resp 16 | Ht 65.5 in | Wt 252.6 lb

## 2014-10-05 DIAGNOSIS — J329 Chronic sinusitis, unspecified: Secondary | ICD-10-CM

## 2014-10-05 DIAGNOSIS — B9789 Other viral agents as the cause of diseases classified elsewhere: Secondary | ICD-10-CM

## 2014-10-05 DIAGNOSIS — J00 Acute nasopharyngitis [common cold]: Secondary | ICD-10-CM

## 2014-10-05 DIAGNOSIS — B349 Viral infection, unspecified: Secondary | ICD-10-CM

## 2014-10-05 MED ORDER — IPRATROPIUM BROMIDE 0.03 % NA SOLN: 2.0000 | Freq: Two times a day (BID) | NASAL | Status: DC

## 2014-10-05 NOTE — Progress Notes (Signed)
Subjective: 34 year old lady who has been having head congestion for 3 or 4 days. She feels discomfort from about the upper lip upwards into her head. Not coughing much. She did have pneumonia last time she had symptoms like this however. She's not been running a fever.  Objective: TMs are normal. Nose congested. Maxillary sinuses a little bit tender. Frontal sinuses nontender. Throat clear. Neck supple without nodes. Chest clear. Heart regular without murmurs.  Assessment: Coming cold/URI/viral sinusitis  Plan: See instructions. Symptomatic treatment.

## 2014-10-05 NOTE — Patient Instructions (Signed)
Drink plenty of fluids and get enough rest  Atrovent nasal 1 or 2 sprays each nostril 4 times daily as needed for head congestion and sinus pressure  Tylenol ibuprofen if needed for pain  Consider taking a Claritin-D or Allegra-D daily to try and help in addition to the nose spray in keeping your sinuses open and draining  Use the remainder of your benzonatate cough tablets if needed  Return if increased fever, shortness of breath, or symptoms generally getting worse. This will probably take much of this week to be doing better.  Good handwashing

## 2014-10-16 ENCOUNTER — Ambulatory Visit (INDEPENDENT_AMBULATORY_CARE_PROVIDER_SITE_OTHER): Payer: Private Health Insurance - Indemnity | Admitting: Family Medicine

## 2014-10-16 ENCOUNTER — Encounter: Payer: Self-pay | Admitting: Family Medicine

## 2014-10-16 VITALS — BP 126/84 | HR 100 | Temp 98.3°F | Ht 65.5 in | Wt 253.5 lb

## 2014-10-16 DIAGNOSIS — J019 Acute sinusitis, unspecified: Secondary | ICD-10-CM | POA: Insufficient documentation

## 2014-10-16 MED ORDER — AMOXICILLIN-POT CLAVULANATE 875-125 MG PO TABS: 1.0000 | ORAL_TABLET | Freq: Two times a day (BID) | ORAL | Status: AC

## 2014-10-16 NOTE — Patient Instructions (Signed)
You have a sinus infection. Take medicine as prescribed: augmentin 10 day course  Push fluids and plenty of rest. Nasal saline irrigation or neti pot to help drain sinuses. May use plain mucinex with plenty of fluid to help mobilize mucous. Please let us know if fever >101.5, trouble opening/closing mouth, difficulty swallowing, or worsening instead of improving as expected.  

## 2014-10-16 NOTE — Progress Notes (Signed)
Pre visit review using our clinic review tool, if applicable. No additional management support is needed unless otherwise documented below in the visit note. 

## 2014-10-16 NOTE — Progress Notes (Signed)
BP 126/84 mmHg  Pulse 100  Temp(Src) 98.3 F (36.8 C) (Oral)  Ht 5' 5.5" (1.664 m)  Wt 253 lb 8 oz (114.987 kg)  BMI 41.53 kg/m2  SpO2 97%  LMP 10/03/2014   CC: sinusitis?  Subjective:    Patient ID: Nicole Burke, female    DOB: 1980/10/17, 34 y.o.   MRN: [id number]  HPI: Nicole Burke is a 34 y.o. female presenting on 10/16/2014 for Sinusitis   New pt appt scheduled for later this year. Will keep CPE at that time.  Presents to discuss acute concern of 1 mo h/o sinus infection. [facility] a few weeks ago with dx viral URI. Advised return if worsening. Persistent head congestion, pressure headaches, productive intermittent cough, PNDrianage and ST. + ear congestion.  No fevers/chills, dyspnea or wheezing.  Increased rest, fluids, using humidifier, mucinex. sxs persist.   Treated for "almost PNA" 1.5 months ago with zpack. No sick contacts at home.  Works at preschool.  No smokers at home No h/o asthma.  Relevant past medical, surgical, family and social history reviewed and updated as indicated. Interim medical history since our last visit reviewed. Allergies and medications reviewed and updated. Current Outpatient Prescriptions on File Prior to Visit  Medication Sig  . ALPRAZolam (XANAX) 0.25 MG tablet Take 0.25 mg by mouth at bedtime as needed.   No current facility-administered medications on file prior to visit.    Review of Systems Per HPI unless specifically indicated above     Objective:    BP 126/84 mmHg  Pulse 100  Temp(Src) 98.3 F (36.8 C) (Oral)  Ht 5' 5.5" (1.664 m)  Wt 253 lb 8 oz (114.987 kg)  BMI 41.53 kg/m2  SpO2 97%  LMP 10/03/2014  Wt Readings from Last 3 Encounters:  10/16/14 253 lb 8 oz (114.987 kg)  10/05/14 252 lb 9.6 oz (114.579 kg)  08/24/14 252 lb (114.306 kg)    Physical Exam  Constitutional: She appears well-developed and well-nourished. No distress.  HENT:  Head: Normocephalic and atraumatic.  Right Ear: Hearing, tympanic  membrane, external ear and ear canal normal.  Left Ear: Hearing, tympanic membrane, external ear and ear canal normal.  Nose: Mucosal edema (nasal mucosal congestion) present. No rhinorrhea. Right sinus exhibits no maxillary sinus tenderness and no frontal sinus tenderness. Left sinus exhibits no maxillary sinus tenderness and no frontal sinus tenderness.  Mouth/Throat: Uvula is midline, oropharynx is clear and moist and mucous membranes are normal. No oropharyngeal exudate, posterior oropharyngeal edema, posterior oropharyngeal erythema or tonsillar abscesses.  Eyes: Conjunctivae and EOM are normal. Pupils are equal, round, and reactive to light. No scleral icterus.  Neck: Normal range of motion. Neck supple.  Cardiovascular: Normal rate, regular rhythm, normal heart sounds and intact distal pulses.   No murmur heard. Pulmonary/Chest: Effort normal and breath sounds normal. No respiratory distress. She has no wheezes. She has no rales.  Lymphadenopathy:    She has no cervical adenopathy.  Skin: Skin is warm and dry. No rash noted.  Nursing note and vitals reviewed.      Assessment & Plan:   Problem List Items Addressed This Visit    Acute sinusitis - Primary    Given duration and progression treat for bacterial sinusitis with augmentin course Update if sxs persist or fail to improve.      Relevant Medications   amoxicillin-clavulanate (AUGMENTIN) tablet 875-125 mg       Follow up plan: Return if symptoms worsen or fail to  improve.

## 2014-10-16 NOTE — Assessment & Plan Note (Signed)
Given duration and progression treat for bacterial sinusitis with augmentin course Update if sxs persist or fail to improve.

## 2014-11-14 ENCOUNTER — Ambulatory Visit (INDEPENDENT_AMBULATORY_CARE_PROVIDER_SITE_OTHER): Payer: Managed Care, Other (non HMO) | Admitting: Physician Assistant

## 2014-11-14 VITALS — BP 128/96 | HR 86 | Temp 98.2°F | Resp 12 | Ht 64.5 in | Wt 252.0 lb

## 2014-11-14 DIAGNOSIS — J029 Acute pharyngitis, unspecified: Secondary | ICD-10-CM

## 2014-11-14 DIAGNOSIS — J020 Streptococcal pharyngitis: Secondary | ICD-10-CM

## 2014-11-14 LAB — POCT RAPID STREP A (OFFICE): Rapid Strep A Screen: POSITIVE — AB

## 2014-11-14 MED ORDER — AMOXICILLIN 875 MG PO TABS: 875.0000 mg | ORAL_TABLET | Freq: Two times a day (BID) | ORAL | Status: DC

## 2014-11-14 NOTE — Patient Instructions (Signed)

## 2014-11-14 NOTE — Progress Notes (Signed)
   Subjective:    Patient ID: Nicole Burke, female    DOB: 07-18-1981, 34 y.o.   MRN: [id number]  HPI Patient presents for sore throat for 1 day. Has begun to lose voice, but is able to swallow ok. Sx are accompanied by fever that resolved with ibuprofen, decreased appetite, and minimal congestion. Denies cough, rhinorrhea, sinus/ear pressure, HA, or N/V. 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 Has tried chloraseptic for relief. Not a smoker and no h/o asthma or allergies.    Review of Systems  Constitutional: Positive for fever and appetite change. Negative for chills and fatigue.  HENT: Positive for congestion and sore throat. Negative for ear discharge, ear pain, postnasal drip, rhinorrhea, sinus pressure, sneezing and trouble swallowing.   Respiratory: Negative for cough and shortness of breath.   Cardiovascular: Negative for chest pain.  Gastrointestinal: Negative for nausea and vomiting.  Neurological: Negative for headaches.       Objective:   Physical Exam  Constitutional: She is oriented to person, place, and time. She appears well-developed and well-nourished. No distress.  Blood pressure 128/96, pulse 86, temperature 98.2 F (36.8 C), temperature source Oral, resp. rate 12, height 5' 4.5" (1.638 m), weight 252 lb (114.306 kg), last menstrual period 11/07/2014, SpO2 98 %.  HENT:  Head: Normocephalic and atraumatic.  Right Ear: Tympanic membrane, external ear and ear canal normal.  Left Ear: Tympanic membrane, external ear and ear canal normal.  Nose: Nose normal. No mucosal edema or rhinorrhea. Right sinus exhibits no maxillary sinus tenderness and no frontal sinus tenderness. Left sinus exhibits no maxillary sinus tenderness and no frontal sinus tenderness.  Mouth/Throat: Uvula is midline and mucous membranes are normal. Posterior oropharyngeal erythema present. No oropharyngeal exudate, posterior oropharyngeal edema or tonsillar abscesses.  2+ hypertrophic,  erythematous tonsils bilaterally without exudate.  Eyes: Conjunctivae are normal. Pupils are equal, round, and reactive to light. Right eye exhibits no discharge. Left eye exhibits no discharge. No scleral icterus.  Neck: Normal range of motion. Neck supple. No thyromegaly present.  Cardiovascular: Normal rate, regular rhythm and normal heart sounds.  Exam reveals no gallop and no friction rub.   No murmur heard. Pulmonary/Chest: Effort normal and breath sounds normal. No respiratory distress. She has no decreased breath sounds. She has no wheezes. She has no rhonchi. She has no rales.  Abdominal: Soft. Bowel sounds are normal. There is no tenderness.  Lymphadenopathy:    She has cervical adenopathy.  Neurological: She is alert and oriented to person, place, and time.  Skin: Skin is warm and dry. She is not diaphoretic. No erythema. No pallor.   Results for orders placed or performed in visit on 11/14/14  POCT rapid strep A  Result Value Ref Range   Rapid Strep A Screen Positive (A) Negative      Assessment & Plan:  1. Strep pharyngitis Guidelines given. Plenty of fluid and rest. Ibuprofen for fever and pain. - amoxicillin (AMOXIL) 875 MG tablet; Take 1 tablet (875 mg total) by mouth 2 (two) times daily.  Dispense: 20 tablet; Refill: 0  2. Sore throat - POCT rapid strep A   Janan Ridgeishira Ellah Otte PA-C  [facility] 11/14/2014 5:03 PM

## 2015-01-29 ENCOUNTER — Ambulatory Visit: Payer: Managed Care, Other (non HMO) | Admitting: Family Medicine

## 2015-02-04 ENCOUNTER — Encounter (HOSPITAL_COMMUNITY): Payer: Self-pay | Admitting: Emergency Medicine

## 2015-02-04 ENCOUNTER — Emergency Department (HOSPITAL_COMMUNITY)
Admission: EM | Admit: 2015-02-04 | Discharge: 2015-02-04 | Disposition: A | Discharge status: Home / Self Care | Payer: Managed Care, Other (non HMO) | Source: Home / Self Care | Attending: Family Medicine | Admitting: Family Medicine

## 2015-02-04 DIAGNOSIS — J0101 Acute recurrent maxillary sinusitis: Secondary | ICD-10-CM

## 2015-02-04 MED ORDER — IPRATROPIUM BROMIDE 0.06 % NA SOLN: 2.0000 | Freq: Four times a day (QID) | NASAL | Status: DC

## 2015-02-04 MED ORDER — DOXYCYCLINE HYCLATE 100 MG PO CAPS: 100.0000 mg | ORAL_CAPSULE | Freq: Two times a day (BID) | ORAL | Status: DC

## 2015-02-04 NOTE — ED Provider Notes (Signed)
CSN: [id number]     Arrival date & time 02/04/15  1745 History   First MD Initiated Contact with Patient 02/04/15 1847     No chief complaint on file.  (Consider location/radiation/quality/duration/timing/severity/associated sxs/prior Treatment) Patient is a 34 y.o. female presenting with URI. The history is provided by the patient.  URI Presenting symptoms: congestion, cough, facial pain and rhinorrhea   Presenting symptoms: no fever   Severity:  Mild Onset quality:  Gradual Duration:  2 weeks Progression:  Unchanged Chronicity:  New Associated symptoms: sinus pain   Associated symptoms: no wheezing   Risk factors: sick contacts     Past Medical History  Diagnosis Date  . Generalized anxiety disorder   . PTSD (post-traumatic stress disorder)     history  . Seasonal allergic rhinitis     mild  . Morbid obesity    Past Surgical History  Procedure Laterality Date  . Wisdom tooth extraction     Family History  Problem Relation Age of Onset  . Cancer Mother     breast, thyroid  . Anxiety disorder Mother   . Asthma Father   . Anxiety disorder Sister   . Kidney disease Maternal Grandmother   . CAD Paternal Grandfather   . Cancer Maternal Uncle     lymphoma - deceased  . Stroke Paternal Grandmother   . Diabetes Neg Hx    History  Substance Use Topics  . Smoking status: Never Smoker   . Smokeless tobacco: Never Used  . Alcohol Use: 4.2 oz/week    7 Standard drinks or equivalent per week   OB History    No data available     Review of Systems  Constitutional: Negative.  Negative for fever.  HENT: Positive for congestion, postnasal drip, rhinorrhea and sinus pressure.   Respiratory: Positive for cough. Negative for shortness of breath and wheezing.   Gastrointestinal: Negative.   Skin: Negative.     Allergies  Sumatriptan and Erythromycin  Home Medications   Prior to Admission medications   Medication Sig Start Date End Date Taking? Authorizing Provider   ALPRAZolam (XANAX) 0.25 MG tablet Take 0.25 mg by mouth at bedtime as needed.    Historical Provider, MD  amoxicillin (AMOXIL) 875 MG tablet Take 1 tablet (875 mg total) by mouth 2 (two) times daily. 11/14/14   Tishira R Brewington, PA-C  doxycycline (VIBRAMYCIN) 100 MG capsule Take 1 capsule (100 mg total) by mouth 2 (two) times daily. 02/04/15   Linna Hoff, MD  ipratropium (ATROVENT) 0.06 % nasal spray Place 2 sprays into both nostrils 4 (four) times daily. 02/04/15   Linna Hoff, MD   BP 152/114 mmHg  Pulse 101  Temp(Src) 98.8 F (37.1 C) (Oral)  Resp 20  SpO2 98% Physical Exam  Constitutional: She is oriented to person, place, and time. She appears well-developed and well-nourished. No distress.  HENT:  Head: Normocephalic.  Right Ear: External ear normal.  Left Ear: External ear normal.  Nose: Mucosal edema, rhinorrhea and sinus tenderness present. Right sinus exhibits maxillary sinus tenderness. Left sinus exhibits maxillary sinus tenderness.  Mouth/Throat: Oropharynx is clear and moist.  Neck: Normal range of motion. Neck supple.  Cardiovascular: Normal rate, regular rhythm, normal heart sounds and intact distal pulses.   Pulmonary/Chest: Effort normal and breath sounds normal.  Lymphadenopathy:    She has no cervical adenopathy.  Neurological: She is alert and oriented to person, place, and time.  Skin: Skin is warm and dry.  Nursing  note and vitals reviewed.   ED Course  Procedures (including critical care time) Labs Review Labs Reviewed - No data to display  Imaging Review No results found.   MDM   1. Acute recurrent maxillary sinusitis        Linna HoffJames D Heli Dino, MD 02/04/15 1901

## 2015-02-04 NOTE — ED Notes (Signed)
Sinus pressure and congestion onset 2 weeks ago.

## 2016-03-21 ENCOUNTER — Telehealth: Payer: Self-pay | Admitting: *Deleted

## 2016-03-21 ENCOUNTER — Ambulatory Visit (INDEPENDENT_AMBULATORY_CARE_PROVIDER_SITE_OTHER): Payer: Managed Care, Other (non HMO) | Admitting: Emergency Medicine

## 2016-03-21 VITALS — BP 126/82 | HR 108 | Temp 98.2°F | Resp 17 | Ht 64.5 in | Wt 239.0 lb

## 2016-03-21 DIAGNOSIS — J029 Acute pharyngitis, unspecified: Secondary | ICD-10-CM | POA: Diagnosis not present

## 2016-03-21 LAB — POCT RAPID STREP A (OFFICE): RAPID STREP A SCREEN: NEGATIVE

## 2016-03-21 MED ORDER — FIRST-DUKES MOUTHWASH MT SUSP: OROMUCOSAL | Status: DC

## 2016-03-21 MED ORDER — AMOXICILLIN 875 MG PO TABS: 875.0000 mg | ORAL_TABLET | Freq: Two times a day (BID) | ORAL | Status: DC

## 2016-03-21 NOTE — Patient Instructions (Addendum)
     IF you received an x-ray today, you will receive an invoice from [facility]. Please contact [facility] at [phone] with questions or concerns regarding your invoice.   IF you received labwork today, you will receive an invoice from [facility]. Please contact [facility] at [phone] with questions or concerns regarding your invoice.   Our billing staff will not be able to assist you with questions regarding bills from these companies.  You will be contacted with the lab results as soon as they are available. The fastest way to get your results is to activate your My Chart account. Instructions are located on the last page of this paperwork. If you have not heard from us regarding the results in 2 weeks, please contact this office.     Sore Throat A sore throat is pain, burning, irritation, or scratchiness of the throat. There is often pain or tenderness when swallowing or talking. A sore throat may be accompanied by other symptoms, such as coughing, sneezing, fever, and swollen neck glands. A sore throat is often the first sign of another sickness, such as a cold, flu, strep throat, or mononucleosis (commonly known as mono). Most sore throats go away without medical treatment. CAUSES  The most common causes of a sore throat include:  A viral infection, such as a cold, flu, or mono.  A bacterial infection, such as strep throat, tonsillitis, or whooping cough.  Seasonal allergies.  Dryness in the air.  Irritants, such as smoke or pollution.  Gastroesophageal reflux disease (GERD). HOME CARE INSTRUCTIONS   Only take over-the-counter medicines as directed by your caregiver.  Drink enough fluids to keep your urine clear or pale yellow.  Rest as needed.  Try using throat sprays, lozenges, or sucking on hard candy to ease any pain (if older than 4 years or as directed).  Sip warm liquids, such as broth, herbal tea, or warm water  with honey to relieve pain temporarily. You may also eat or drink cold or frozen liquids such as frozen ice pops.  Gargle with salt water (mix 1 tsp salt with 8 oz of water).  Do not smoke and avoid secondhand smoke.  Put a cool-mist humidifier in your bedroom at night to moisten the air. You can also turn on a hot shower and sit in the bathroom with the door closed for 5-10 minutes. SEEK IMMEDIATE MEDICAL CARE IF:  You have difficulty breathing.  You are unable to swallow fluids, soft foods, or your saliva.  You have increased swelling in the throat.  Your sore throat does not get better in 7 days.  You have nausea and vomiting.  You have a fever or persistent symptoms for more than 2-3 days.  You have a fever and your symptoms suddenly get worse. MAKE SURE YOU:   Understand these instructions.  Will watch your condition.  Will get help right away if you are not doing well or get worse.   This information is not intended to replace advice given to you by your health care provider. Make sure you discuss any questions you have with your health care provider.   Document Released: 10/05/2004 Document Revised: 09/18/2014 Document Reviewed: 05/05/2012 Elsevier Interactive Patient Education 2016 Elsevier Inc.  

## 2016-03-21 NOTE — Progress Notes (Addendum)
By signing my name below, I, Mesha Guinyard, attest that this documentation has been prepared under the direction and in the presence of Lesle ChrisSteven Daub, MD.  Electronically Signed: Arvilla MarketMesha Guinyard, Medical Scribe. 03/21/2016. 11:55 AM.  Chief Complaint:  Chief Complaint  Patient presents with  . Sore Throat  . Fatigue    HPI: Nicole Burke is a 35 y.o. female [facility] complaining of sore throat that began for a couple of days. Pt reports some cough in the morning, but not much. Pt mentions she feels a little ill, but she isn't sure if it's due to the weather. 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 Pt reports sore lymph nodes in her neck with palpation. Pt denies fever, rash, and ear pain. Pt denies possibilty of pregnancy- she is currently on her menses.  Past Medical History  Diagnosis Date  . Generalized anxiety disorder   . PTSD (post-traumatic stress disorder)     history  . Seasonal allergic rhinitis     mild  . Morbid obesity [facility](HCC)    Past Surgical History  Procedure Laterality Date  . Wisdom tooth extraction     Social History   Social History  . Marital Status: Single    Spouse Name: N/A  . Number of Children: N/A  . Years of Education: N/A   Social History Main Topics  . Smoking status: Never Smoker   . Smokeless tobacco: Never Used  . Alcohol Use: 4.2 oz/week    7 Standard drinks or equivalent per week  . Drug Use: No  . Sexual Activity: Yes    Birth Control/ Protection: Condom   Other Topics Concern  . None   Social History Narrative   Family History  Problem Relation Age of Onset  . Cancer Mother     breast, thyroid  . Anxiety disorder Mother   . Asthma Father   . Anxiety disorder Sister   . Kidney disease Maternal Grandmother   . CAD Paternal Grandfather   . Cancer Maternal Uncle     lymphoma - deceased  . Stroke Paternal  Grandmother   . Diabetes Neg Hx    Allergies  Allergen Reactions  . Sumatriptan Swelling  . Erythromycin Rash   Prior to Admission medications   Medication Sig Start Date End Date Taking? Authorizing Provider  ALPRAZolam (XANAX) 0.25 MG tablet Take 0.25 mg by mouth at bedtime as needed.   Yes Historical Provider, MD     ROS: The patient denies fevers, chills, night sweats, unintentional weight loss, chest pain, palpitations, wheezing, dyspnea on exertion, nausea, vomiting, abdominal pain, dysuria, hematuria, melena, numbness, weakness, or tingling. +sore throat. +cough.  All other systems have been reviewed and were otherwise negative with the exception of those mentioned in the HPI and as above.    PHYSICAL EXAM: Filed Vitals:   03/21/16 1131  BP: 126/82  Pulse: 108  Temp: 98.2 F (36.8 C)  Resp: 17   Body mass index is 40.41 kg/(m^2).   General: Alert, no acute distress HEENT:  Normocephalic, atraumatic, oropharynx patent. Ears normal. Nose normal. Redness in the posterior pharynx bilaterally with exudate Eye: [facility] Cardiovascular:  Regular rate and rhythm, no rubs murmurs or gallops.  No Carotid bruits, radial pulse intact. No pedal edema.  Respiratory: Clear to auscultation bilaterally.  No wheezes, rales, or rhonchi.  No  cyanosis, no use of accessory musculature Abdominal: No organomegaly, abdomen is soft and non-tender, positive bowel sounds.  No masses. Musculoskeletal: Gait intact. No edema, tenderness Skin: No rashes. Neurologic: Facial musculature symmetric. Psychiatric: Patient acts appropriately throughout our interaction. Lymphatic: No cervical or submandibular lymphadenopathy. Mild tenderness left posterior cervical nodes  LABS: Results for orders placed or performed in visit on 03/21/16  POCT rapid strep A  Result Value Ref Range   Rapid Strep A Screen Negative Negative   EKG/XRAY:   Primary read interpreted by Dr. Cleta Alberts at  [facility].   ASSESSMENT/PLAN: Strep test is negative we'll treat with amoxicillin and Dukes mouthwash.Patient was also concerned because a friend notified her he tested positive for chlamydia. There is no sexual intercourse with this person she only kissed him. She is concerned she could have gonorrhea or chlamydia in her throat. Because of this culture was done for gonorrhea and chlamydia as well as strep. I personally performed the services described in this documentation, which was scribed in my presence. The recorded information has been reviewed and is accurate.  Gross sideeffects, risk and benefits, and alternatives of medications d/w patient. Patient is aware that all medications have potential sideeffects and we are unable to predict every sideeffect or drug-drug interaction that may occur.  Lesle Chris MD 03/21/2016 11:55 AM

## 2016-03-21 NOTE — Telephone Encounter (Signed)
Received call for right leg venous doppler. Negative for DVT. Mild edema in calf and ankle. They let the patient go home.

## 2016-03-23 LAB — CULTURE, GROUP A STREP: Organism ID, Bacteria: NORMAL

## 2016-03-25 ENCOUNTER — Ambulatory Visit: Payer: Managed Care, Other (non HMO)

## 2016-03-30 ENCOUNTER — Telehealth: Payer: Self-pay

## 2016-03-30 NOTE — Telephone Encounter (Signed)
Pt is needing to get her lab results and wants to let us know that the fever has returned  Best number [phone]

## 2016-03-30 NOTE — Telephone Encounter (Signed)
Patient is calling because she hasn't heard anything. Patient states that she went to work today and that might be why the fever came back. Please advise!

## 2016-03-31 LAB — CHLAMYDIA CULTURE

## 2016-03-31 NOTE — Telephone Encounter (Signed)
Looks like we are waiting on one test to come back.

## 2016-03-31 NOTE — Telephone Encounter (Signed)
We called the lab again today. I am sorry for the inconvenience. Please check back with us again Monday and hopefully the results will be in by then.

## 2016-04-03 LAB — OTHER SOLSTAS TEST

## 2016-04-04 NOTE — Telephone Encounter (Signed)
Lab can you check on this?

## 2016-04-04 NOTE — Telephone Encounter (Signed)
Left message on machine with results and to call back with any questions or concerns

## 2016-04-05 ENCOUNTER — Ambulatory Visit (INDEPENDENT_AMBULATORY_CARE_PROVIDER_SITE_OTHER): Payer: Managed Care, Other (non HMO) | Admitting: Family Medicine

## 2016-04-05 VITALS — BP 126/82 | HR 97 | Temp 98.1°F | Resp 16 | Ht 64.5 in | Wt 239.0 lb

## 2016-04-05 DIAGNOSIS — J0100 Acute maxillary sinusitis, unspecified: Secondary | ICD-10-CM | POA: Diagnosis not present

## 2016-04-05 MED ORDER — AMOXICILLIN-POT CLAVULANATE 875-125 MG PO TABS: 1.0000 | ORAL_TABLET | Freq: Two times a day (BID) | ORAL | 0 refills | Status: DC

## 2016-04-05 MED ORDER — FLUCONAZOLE 150 MG PO TABS: 150.0000 mg | ORAL_TABLET | Freq: Once | ORAL | 0 refills | Status: AC

## 2016-04-05 NOTE — Patient Instructions (Addendum)
  Great to meet you!   Sinusitis, Adult Sinusitis is redness, soreness, and puffiness (inflammation) of the air pockets in the bones of your face (sinuses). The redness, soreness, and puffiness can cause air and mucus to get trapped in your sinuses. This can allow germs to grow and cause an infection.  HOME CARE   Drink enough fluids to keep your pee (urine) clear or pale yellow.  Use a humidifier in your home.  Run a hot shower to create steam in the bathroom. Sit in the bathroom with the door closed. Breathe in the steam 3-4 times a day.  Put a warm, moist washcloth on your face 3-4 times a day, or as told by your doctor.  Use salt water sprays (saline sprays) to wet the thick fluid in your nose. This can help the sinuses drain.  Only take medicine as told by your doctor. GET HELP RIGHT AWAY IF:   Your pain gets worse.  You have very bad headaches.  You are sick to your stomach (nauseous).  You throw up (vomit).  You are very sleepy (drowsy) all the time.  Your face is puffy (swollen).  Your vision changes.  You have a stiff neck.  You have trouble breathing. MAKE SURE YOU:   Understand these instructions.  Will watch your condition.  Will get help right away if you are not doing well or get worse.   This information is not intended to replace advice given to you by your health care provider. Make sure you discuss any questions you have with your health care provider.   Document Released: 02/14/2008 Document Revised: 09/18/2014 Document Reviewed: 04/02/2012 Elsevier Interactive Patient Education 2016 Elsevier Inc.      IF you received an x-ray today, you will receive an invoice from [facility]. Please contact [facility] at [phone] with questions or concerns regarding your invoice.   IF you received labwork today, you will receive an invoice from [facility]. Please contact [facility] at [phone] with  questions or concerns regarding your invoice.   Our billing staff will not be able to assist you with questions regarding bills from these companies.  You will be contacted with the lab results as soon as they are available. The fastest way to get your results is to activate your My Chart account. Instructions are located on the last page of this paperwork. If you have not heard from us regarding the results in 2 weeks, please contact this office.     

## 2016-04-05 NOTE — Progress Notes (Signed)
   HPI  Patient presents today with concern for sinus infection.  She was seen about 1 week ago and treated for presumed strep pharyngitis. She took 2 days of amox and quit when her cultures were negative.   She has not had any improvement in symptoms. She explains she has had a total of 2 weeks of sore throat, malaise, body aches, cough.   She denies dyspnea, chest pain, difficulty tolerating food or fluid, and facial pain.   She had a negative strep culture, as well as negative GCC.   She has been out of work 3 days and would like to stay out for a couple more days to recover.   PMH: Smoking status noted ROS: Per HPI  Objective: BP 126/82   Pulse 97   Temp 98.1 F (36.7 C) (Oral)   Resp 16   Ht 5' 4.5" (1.638 m)   Wt 239 lb (108.4 kg)   LMP 03/16/2016   SpO2 99%   BMI 40.39 kg/m  Gen: NAD, alert, cooperative with exam HEENT: NCAT, BL maxillary sinus pressure to palp, TMs WNL, Nares clear, oropharynx with some mucus on the pharynx and enlarged tonsils R>L No Tender LAD CV: RRR, good S1/S2, no murmur Resp: CTABL, no wheezes, non-labored Ext: No edema, warm Neuro: Alert and oriented, No gross deficits  Assessment and plan:  # Acute sinusitis WIth maxillary tenderness to palp I am covering with augmentin. Also note that she has had 2 days of amox recently. Discussed pro-biotics and completing entire course Work note written RTC with any concerns    Meds ordered this encounter  Medications  . amoxicillin-clavulanate (AUGMENTIN) 875-125 MG tablet    Sig: Take 1 tablet by mouth 2 (two) times daily.    Dispense:  20 tablet    Refill:  0  . fluconazole (DIFLUCAN) 150 MG tablet    Sig: Take 1 tablet (150 mg total) by mouth once.    Dispense:  1 tablet    Refill:  0    Kevin Fenton, MD 11:36 AM

## 2016-09-07 ENCOUNTER — Ambulatory Visit (INDEPENDENT_AMBULATORY_CARE_PROVIDER_SITE_OTHER): Payer: Managed Care, Other (non HMO) | Admitting: Physician Assistant

## 2016-09-07 VITALS — BP 142/90 | HR 104 | Temp 97.3°F | Resp 17 | Ht 64.5 in | Wt 238.0 lb

## 2016-09-07 DIAGNOSIS — R05 Cough: Secondary | ICD-10-CM | POA: Diagnosis not present

## 2016-09-07 DIAGNOSIS — B349 Viral infection, unspecified: Secondary | ICD-10-CM | POA: Diagnosis not present

## 2016-09-07 DIAGNOSIS — R059 Cough, unspecified: Secondary | ICD-10-CM

## 2016-09-07 DIAGNOSIS — R0981 Nasal congestion: Secondary | ICD-10-CM

## 2016-09-07 MED ORDER — MUCINEX DM MAXIMUM STRENGTH 60-1200 MG PO TB12: 1.0000 | ORAL_TABLET | Freq: Two times a day (BID) | ORAL | 0 refills | Status: AC

## 2016-09-07 MED ORDER — HYDROCODONE-HOMATROPINE 5-1.5 MG/5ML PO SYRP: 5.0000 mL | ORAL_SOLUTION | Freq: Three times a day (TID) | ORAL | 0 refills | Status: AC | PRN

## 2016-09-07 NOTE — Progress Notes (Signed)
   Nicole Burke  MRN: [id number] DOB: 1980-09-18  PCP: Pcp Not In System  Subjective:  Pt is a 35 year old female who presents to clinic for headache, fatigue and dizziness x three days.  C/o nasal congestion, cough, headache and fatigue. Cough and congestion is keeping her up at night. Cough is non-productive. Feels like she is coughing because her throat is irritated. She has taken ibuprofen and "cold and flu stuff". She is trying to drink plenty of fluids.  Denies wheezing, SOB, chest pain, chest tightness, fever, chills, nausea, vomiting.  Current on flu shot.   Review of Systems  Constitutional: Negative for chills, diaphoresis, fatigue and fever.  HENT: Positive for congestion, postnasal drip and rhinorrhea. Negative for sinus pressure, sneezing and sore throat.   Respiratory: Positive for cough. Negative for chest tightness, shortness of breath and wheezing.   Cardiovascular: Negative for chest pain and palpitations.  Gastrointestinal: Negative for abdominal pain, diarrhea, nausea and vomiting.  Neurological: Negative for weakness, light-headedness and headaches.  Psychiatric/Behavioral: Positive for sleep disturbance.    Patient Active Problem List   Diagnosis Date Noted  . Acute sinusitis 10/16/2014  . Morbid obesity (HCC)     Current Outpatient Prescriptions on File Prior to Visit  Medication Sig Dispense Refill  . ALPRAZolam (XANAX) 0.25 MG tablet Take 0.25 mg by mouth at bedtime as needed.     No current facility-administered medications on file prior to visit.     Allergies  Allergen Reactions  . Sumatriptan Swelling  . Erythromycin Rash     Objective:  BP (!) 142/90 (BP Location: Right Arm, Patient Position: Sitting, Cuff Size: Large)   Pulse (!) 104   Temp 97.3 F (36.3 C) (Oral)   Resp 17   Ht 5' 4.5" (1.638 m)   Wt 238 lb (108 kg)   LMP 09/07/2016   SpO2 98%   BMI 40.22 kg/m   Physical Exam  Constitutional: She is oriented to person, place, and time  and well-developed, well-nourished, and in no distress. No distress.  HENT:  Nose: Mucosal edema and rhinorrhea present.  Mouth/Throat: Mucous membranes are normal. Posterior oropharyngeal edema present. No oropharyngeal exudate or posterior oropharyngeal erythema.  Neck: Normal range of motion. Neck supple.  Cardiovascular: Normal rate, regular rhythm and normal heart sounds.   Pulmonary/Chest: Effort normal and breath sounds normal. No respiratory distress.  Lymphadenopathy:       Head (right side): Preauricular adenopathy present.       Head (left side): Preauricular adenopathy present.  Neurological: She is alert and oriented to person, place, and time. GCS score is 15.  Skin: Skin is warm and dry.  Psychiatric: Mood, memory, affect and judgment normal.  Vitals reviewed.   Assessment and Plan :  1. Viral illness 2. Cough 3. Nasal congestion - HYDROcodone-homatropine (HYCODAN) 5-1.5 MG/5ML syrup; Take 5 mLs by mouth every 8 (eight) hours as needed for cough.  Dispense: 120 mL; Refill: 0 - Dextromethorphan-Guaifenesin (MUCINEX DM MAXIMUM STRENGTH) 60-1200 MG TB12; Take 1 tablet by mouth every 12 (twelve) hours.  Dispense: 14 each; Refill: 0 - Supportive care encouraged: Drink plenty of fluids and rest. RTC in 5-7 days if no improvement.    Marco CollieWhitney Evon Lopezperez, PA-C  [facility] 09/07/2016 2:13 PM

## 2016-09-09 ENCOUNTER — Telehealth: Payer: Self-pay

## 2016-09-09 NOTE — Telephone Encounter (Signed)
MCVEY - Pt says she still feels bad and did not go into work today.  Can you extend her note through today?  [phone]

## 2016-09-12 ENCOUNTER — Telehealth: Payer: Self-pay

## 2016-09-12 NOTE — Telephone Encounter (Signed)
Called and left message advising patient she would have to be re-evaluated in the office to see why she is still not feeling better before we can extend her note.  She was also advised that her after visit summary indicated she return in the next 5-7 days if not better. Advised to call back if she has questions or concerns.

## 2016-09-12 NOTE — Telephone Encounter (Signed)
Pt has a work note to be out of the office til today but pt still is not feeling better and would like to extend the note to cover being out til today and returning tomorrow   Best number [phone]

## 2016-09-13 ENCOUNTER — Ambulatory Visit (INDEPENDENT_AMBULATORY_CARE_PROVIDER_SITE_OTHER): Payer: Managed Care, Other (non HMO) | Admitting: Physician Assistant

## 2016-09-13 VITALS — BP 118/78 | HR 112 | Temp 98.6°F | Resp 17 | Ht 65.5 in | Wt 234.0 lb

## 2016-09-13 DIAGNOSIS — R112 Nausea with vomiting, unspecified: Secondary | ICD-10-CM

## 2016-09-13 DIAGNOSIS — R0981 Nasal congestion: Secondary | ICD-10-CM

## 2016-09-13 DIAGNOSIS — K2900 Acute gastritis without bleeding: Secondary | ICD-10-CM | POA: Diagnosis not present

## 2016-09-13 DIAGNOSIS — R197 Diarrhea, unspecified: Secondary | ICD-10-CM

## 2016-09-13 LAB — POCT CBC
Granulocyte percent: 68.1 %G (ref 37–80)
HCT, POC: 46.7 % (ref 37.7–47.9)
Hemoglobin: 16.8 g/dL — AB (ref 12.2–16.2)
Lymph, poc: 1.6 (ref 0.6–3.4)
MCH, POC: 33.5 pg — AB (ref 27–31.2)
MCHC: 35.9 g/dL — AB (ref 31.8–35.4)
MCV: 93.2 fL (ref 80–97)
MID (cbc): 0.3 (ref 0–0.9)
MPV: 8.5 fL (ref 0–99.8)
POC Granulocyte: 4 (ref 2–6.9)
POC LYMPH PERCENT: 26.8 % (ref 10–50)
POC MID %: 5.1 %M (ref 0–12)
Platelet Count, POC: 259 10*3/uL (ref 142–424)
RBC: 5 M/uL (ref 4.04–5.48)
RDW, POC: 11.6 %
WBC: 5.9 10*3/uL (ref 4.6–10.2)

## 2016-09-13 NOTE — Progress Notes (Signed)
Nicole Burke  MRN: [id number] DOB: 22-Dec-1980  PCP: Pcp Not In System  Subjective:  Pt is a 36 year old female presents to clinic for f/u URI.  She was here one week ago, treated for viral illness with Hycodan and Mucinex.  Since that time she has gotten worse and then better. Her cough stopped yesterday.  Her congestion is "clearing up a lot", cough is better. C/o "stuffy nose".   Today c/o new onset Vomiting - several episodes last night Diarrhea - Two episodes last night and three episodes this morning. Light brown and not solid. +headache, mild congestion. Denies abdominal bloating, fever, chills, chest pain, palpitations. No recent antibiotics.  Review of Systems  Constitutional: Negative for chills, diaphoresis, fatigue and fever.  HENT: Negative for congestion, postnasal drip, rhinorrhea, sinus pressure, sneezing and sore throat.   Respiratory: Negative for cough, chest tightness, shortness of breath and wheezing.   Cardiovascular: Negative for chest pain and palpitations.  Gastrointestinal: Positive for abdominal pain, diarrhea, nausea and vomiting.  Neurological: Negative for weakness, light-headedness and headaches.    Patient Active Problem List   Diagnosis Date Noted  . Acute sinusitis 10/16/2014  . Morbid obesity (HCC)     Current Outpatient Prescriptions on File Prior to Visit  Medication Sig Dispense Refill  . ALPRAZolam (XANAX) 0.25 MG tablet Take 0.25 mg by mouth at bedtime as needed.    Marland Kitchen HYDROcodone-homatropine (HYCODAN) 5-1.5 MG/5ML syrup Take 5 mLs by mouth every 8 (eight) hours as needed for cough. 120 mL 0  . Dextromethorphan-Guaifenesin (MUCINEX DM MAXIMUM STRENGTH) 60-1200 MG TB12 Take 1 tablet by mouth every 12 (twelve) hours. (Patient not taking: Reported on 09/13/2016) 14 each 0   No current facility-administered medications on file prior to visit.     Allergies  Allergen Reactions  . Sumatriptan Swelling  . Erythromycin Rash     Objective:  BP  118/78 (BP Location: Right Arm, Patient Position: Sitting, Cuff Size: Normal)   Pulse (!) 112   Temp 98.6 F (37 C) (Oral)   Resp 17   Ht 5' 5.5" (1.664 m)   Wt 234 lb (106.1 kg)   LMP 09/07/2016   SpO2 97%   BMI 38.35 kg/m   Physical Exam  Constitutional: She is oriented to person, place, and time and well-developed, well-nourished, and in no distress. No distress.  Cardiovascular: Normal rate, regular rhythm and normal heart sounds.   Pulmonary/Chest: Effort normal. No respiratory distress. She has no wheezes. She has no rales.  Abdominal: Soft. Normal appearance. Bowel sounds are hyperactive. There is tenderness in the epigastric area.  Neurological: She is alert and oriented to person, place, and time. GCS score is 15.  Skin: Skin is warm and dry.  Psychiatric: Mood, memory, affect and judgment normal.  Vitals reviewed.  Results for orders placed or performed in visit on 09/13/16  POCT CBC  Result Value Ref Range   WBC 5.9 4.6 - 10.2 K/uL   Lymph, poc 1.6 0.6 - 3.4   POC LYMPH PERCENT 26.8 10 - 50 %L   MID (cbc) 0.3 0 - 0.9   POC MID % 5.1 0 - 12 %M   POC Granulocyte 4.0 2 - 6.9   Granulocyte percent 68.1 37 - 80 %G   RBC 5.00 4.04 - 5.48 M/uL   Hemoglobin 16.8 (A) 12.2 - 16.2 g/dL   HCT, POC 40.9 81.1 - 47.9 %   MCV 93.2 80 - 97 fL   MCH, POC 33.5 (A)  27 - 31.2 pg   MCHC 35.9 (A) 31.8 - 35.4 g/dL   RDW, POC 16.111.6 %   Platelet Count, POC 259 142 - 424 K/uL   MPV 8.5 0 - 99.8 fL    Assessment and Plan :  1. Acute gastritis without hemorrhage, unspecified gastritis type 2. Nausea and vomiting, intractability of vomiting not specified, unspecified vomiting type 3. Diarrhea, unspecified type 4. Nasal congestion - POCT CBC - Basic metabolic panel - Labs pending, will contact with results. Assume nasal drainage contributing to upset stomach. Suspect self-limiting illness. Supportive care: Encouraged bland diet for a few days, ease back into regular foods. Stay well  hydrated. RTC if symptoms worsen/do not improve.   [name], PA-C  [facility] 09/13/2016 2:23 PM

## 2016-09-13 NOTE — Patient Instructions (Addendum)
You will be contacted with the results of your labwork. Please try to stray well hydrated and follow the diet plan below for the next few days. Stay well hydrated.  Come back if you aren't better in 5-7 days.   Thank you for coming in today. I hope you feel we met your needs.  Feel free to call [facility] if you have any questions or further requests.  Please consider signing up for MyChart if you do not already have it, as this is a great way to communicate with me.  Best,  Whitney McVey, PA-C   Bland Diet Introduction A bland diet consists of foods that do not have a lot of fat or fiber. Foods without fat or fiber are easier for the body to digest. They are also less likely to irritate your mouth, throat, stomach, and other parts of your gastrointestinal tract. A bland diet is sometimes called a BRAT diet. What is my plan? Your health care provider or dietitian may recommend specific changes to your diet to prevent and treat your symptoms, such as:  Eating small meals often.  Cooking food until it is soft enough to chew easily.  Chewing your food well.  Drinking fluids slowly.  Not eating foods that are very spicy, sour, or fatty.  Not eating citrus fruits, such as oranges and grapefruit. What do I need to know about this diet?  Eat a variety of foods from the bland diet food list.  Do not follow a bland diet longer than you have to.  Ask your health care provider whether you should take vitamins. What foods can I eat? Grains  Hot cereals, such as cream of wheat. Bread, crackers, or tortillas made from refined white flour. Rice. Vegetables  Canned or cooked vegetables. Mashed or boiled potatoes. Fruits  Bananas. Applesauce. Other types of cooked or canned fruit with the skin and seeds removed, such as canned peaches or pears. Meats and Other Protein Sources  Scrambled eggs. Creamy peanut butter or other nut butters. Lean, well-cooked meats, such as chicken or fish. Tofu. Soups  or broths. Dairy  Low-fat dairy products, such as milk, cottage cheese, or yogurt. Beverages  Water. Herbal tea. Apple juice. Sweets and Desserts  Pudding. Custard. Fruit gelatin. Ice cream. Fats and Oils  Mild salad dressings. Canola or olive oil. The items listed above may not be a complete list of allowed foods or beverages. Contact your dietitian for more options.  What foods are not recommended? Foods and ingredients that are often not recommended include:  Spicy foods, such as hot sauce or salsa.  Fried foods.  Sour foods, such as pickled or fermented foods.  Raw vegetables or fruits, especially citrus or berries.  Caffeinated drinks.  Alcohol.  Strongly flavored seasonings or condiments. The items listed above may not be a complete list of foods and beverages that are not allowed. Contact your dietitian for more information.  This information is not intended to replace advice given to you by your health care provider. Make sure you discuss any questions you have with your health care provider. Document Released: 12/20/2015 Document Revised: 02/03/2016 Document Reviewed: 09/09/2014  2017 Elsevier    IF you received an x-ray today, you will receive an invoice from [facility]. Please contact [facility] at [phone] with questions or concerns regarding your invoice.   IF you received labwork today, you will receive an invoice from [facility]. Please contact [facility] at [phone] with questions or concerns regarding your invoice.  Our billing staff will not be able to assist you with questions regarding bills from these companies.  You will be contacted with the lab results as soon as they are available. The fastest way to get your results is to activate your My Chart account. Instructions are located on the last page of this paperwork. If you have not heard from [place] regarding the results in 2 weeks, please contact this office.

## 2016-09-13 NOTE — Telephone Encounter (Signed)
Thank you :)

## 2016-09-14 LAB — BASIC METABOLIC PANEL
BUN/Creatinine Ratio: 12 (ref 9–23)
BUN: 10 mg/dL (ref 6–20)
Calcium: 9.9 mg/dL (ref 8.7–10.2)
Chloride: 96 mmol/L (ref 96–106)
GFR calc Af Amer: 104 mL/min/{1.73_m2} (ref >59)
Glucose: 114 mg/dL — ABNORMAL HIGH (ref 65–99)
Potassium: 4.8 mmol/L (ref 3.5–5.2)

## 2016-09-14 LAB — BASIC METABOLIC PANEL WITH GFR
CO2: 24 mmol/L (ref 18–29)
Creatinine, Ser: 0.85 mg/dL (ref 0.57–1.00)
GFR calc non Af Amer: 90 mL/min/{1.73_m2} (ref >59)
Sodium: 139 mmol/L (ref 134–144)
